# Patient Record
Sex: Female | Born: 1980 | Race: Black or African American | Hispanic: No | Marital: Single | Smoking: Current some day smoker
Health system: Southern US, Community
[De-identification: ages and names within clinical notes are randomized; demographics above are authoritative.]

## PROBLEM LIST (undated history)

## (undated) DIAGNOSIS — Z8632 Personal history of gestational diabetes: Secondary | ICD-10-CM

## (undated) HISTORY — DX: Personal history of gestational diabetes: Z86.32

## (undated) HISTORY — PX: ESOPHAGOGASTRODUODENOSCOPY ENDOSCOPY: SHX5814

---
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
## 2003-05-23 ENCOUNTER — Emergency Department (HOSPITAL_COMMUNITY): Admission: EM | Admit: 2003-05-23 | Discharge: 2003-05-23 | Payer: Self-pay | Admitting: Emergency Medicine

## 2004-07-25 ENCOUNTER — Emergency Department (HOSPITAL_COMMUNITY): Admission: EM | Admit: 2004-07-25 | Discharge: 2004-07-25 | Payer: Self-pay | Admitting: Emergency Medicine

## 2006-02-25 ENCOUNTER — Emergency Department (HOSPITAL_COMMUNITY): Admission: EM | Admit: 2006-02-25 | Discharge: 2006-02-25 | Payer: Self-pay | Admitting: Emergency Medicine

## 2006-05-03 ENCOUNTER — Emergency Department (HOSPITAL_COMMUNITY): Admission: EM | Admit: 2006-05-03 | Discharge: 2006-05-03 | Payer: Self-pay | Admitting: Family Medicine

## 2006-12-04 ENCOUNTER — Emergency Department (HOSPITAL_COMMUNITY): Admission: EM | Admit: 2006-12-04 | Discharge: 2006-12-04 | Payer: Self-pay | Admitting: Emergency Medicine

## 2007-02-11 ENCOUNTER — Emergency Department (HOSPITAL_COMMUNITY): Admission: EM | Admit: 2007-02-11 | Discharge: 2007-02-11 | Payer: Self-pay | Admitting: Emergency Medicine

## 2007-03-07 DIAGNOSIS — Z8632 Personal history of gestational diabetes: Secondary | ICD-10-CM

## 2007-03-07 HISTORY — DX: Personal history of gestational diabetes: Z86.32

## 2007-03-25 ENCOUNTER — Emergency Department (HOSPITAL_COMMUNITY): Admission: EM | Admit: 2007-03-25 | Discharge: 2007-03-25 | Payer: Self-pay | Admitting: Emergency Medicine

## 2007-04-07 ENCOUNTER — Emergency Department (HOSPITAL_COMMUNITY): Admission: EM | Admit: 2007-04-07 | Discharge: 2007-04-08 | Payer: Self-pay | Admitting: Emergency Medicine

## 2007-04-30 ENCOUNTER — Emergency Department (HOSPITAL_COMMUNITY): Admission: EM | Admit: 2007-04-30 | Discharge: 2007-04-30 | Payer: Self-pay | Admitting: Emergency Medicine

## 2007-07-02 ENCOUNTER — Emergency Department (HOSPITAL_COMMUNITY): Admission: EM | Admit: 2007-07-02 | Discharge: 2007-07-03 | Payer: Self-pay | Admitting: Emergency Medicine

## 2007-07-18 ENCOUNTER — Other Ambulatory Visit: Admission: RE | Admit: 2007-07-18 | Discharge: 2007-07-18 | Payer: Self-pay | Admitting: Obstetrics and Gynecology

## 2007-10-12 ENCOUNTER — Ambulatory Visit: Payer: Self-pay | Admitting: Obstetrics & Gynecology

## 2007-10-12 ENCOUNTER — Inpatient Hospital Stay (HOSPITAL_COMMUNITY): Admission: AD | Admit: 2007-10-12 | Discharge: 2007-10-12 | Payer: Self-pay | Admitting: Obstetrics & Gynecology

## 2007-12-01 ENCOUNTER — Inpatient Hospital Stay (HOSPITAL_COMMUNITY): Admission: AD | Admit: 2007-12-01 | Discharge: 2007-12-01 | Payer: Self-pay | Admitting: Obstetrics & Gynecology

## 2008-02-12 ENCOUNTER — Ambulatory Visit: Payer: Self-pay | Admitting: Family Medicine

## 2008-02-12 ENCOUNTER — Inpatient Hospital Stay (HOSPITAL_COMMUNITY): Admission: RE | Admit: 2008-02-12 | Discharge: 2008-02-15 | Payer: Self-pay | Admitting: Obstetrics & Gynecology

## 2008-07-23 ENCOUNTER — Emergency Department (HOSPITAL_COMMUNITY): Admission: EM | Admit: 2008-07-23 | Discharge: 2008-07-23 | Payer: Self-pay | Admitting: Emergency Medicine

## 2008-09-04 ENCOUNTER — Emergency Department (HOSPITAL_COMMUNITY): Admission: EM | Admit: 2008-09-04 | Discharge: 2008-09-04 | Payer: Self-pay | Admitting: Emergency Medicine

## 2008-09-08 ENCOUNTER — Encounter (INDEPENDENT_AMBULATORY_CARE_PROVIDER_SITE_OTHER): Payer: Self-pay | Admitting: *Deleted

## 2008-10-02 ENCOUNTER — Emergency Department (HOSPITAL_COMMUNITY): Admission: EM | Admit: 2008-10-02 | Discharge: 2008-10-02 | Payer: Self-pay | Admitting: Emergency Medicine

## 2008-10-16 ENCOUNTER — Emergency Department (HOSPITAL_COMMUNITY): Admission: EM | Admit: 2008-10-16 | Discharge: 2008-10-16 | Payer: Self-pay | Admitting: Emergency Medicine

## 2008-10-28 ENCOUNTER — Ambulatory Visit: Payer: Self-pay | Admitting: Gastroenterology

## 2008-10-28 DIAGNOSIS — R1013 Epigastric pain: Secondary | ICD-10-CM

## 2008-11-03 ENCOUNTER — Encounter: Payer: Self-pay | Admitting: Gastroenterology

## 2008-11-04 ENCOUNTER — Ambulatory Visit (HOSPITAL_COMMUNITY): Admission: RE | Admit: 2008-11-04 | Discharge: 2008-11-04 | Payer: Self-pay | Admitting: Gastroenterology

## 2008-11-04 ENCOUNTER — Ambulatory Visit: Payer: Self-pay | Admitting: Gastroenterology

## 2008-11-04 ENCOUNTER — Encounter: Payer: Self-pay | Admitting: Gastroenterology

## 2009-07-07 ENCOUNTER — Emergency Department (HOSPITAL_COMMUNITY): Admission: EM | Admit: 2009-07-07 | Discharge: 2009-07-07 | Payer: Self-pay | Admitting: Emergency Medicine

## 2009-08-03 IMAGING — CR DG ABDOMEN 1V
2 series · 2 of 2 positions shown · non-contrast
Comparison: None

CLINICAL DATA: Abdominal pain

ABDOMEN - 1 VIEW

[view not recorded (1 of 2)]
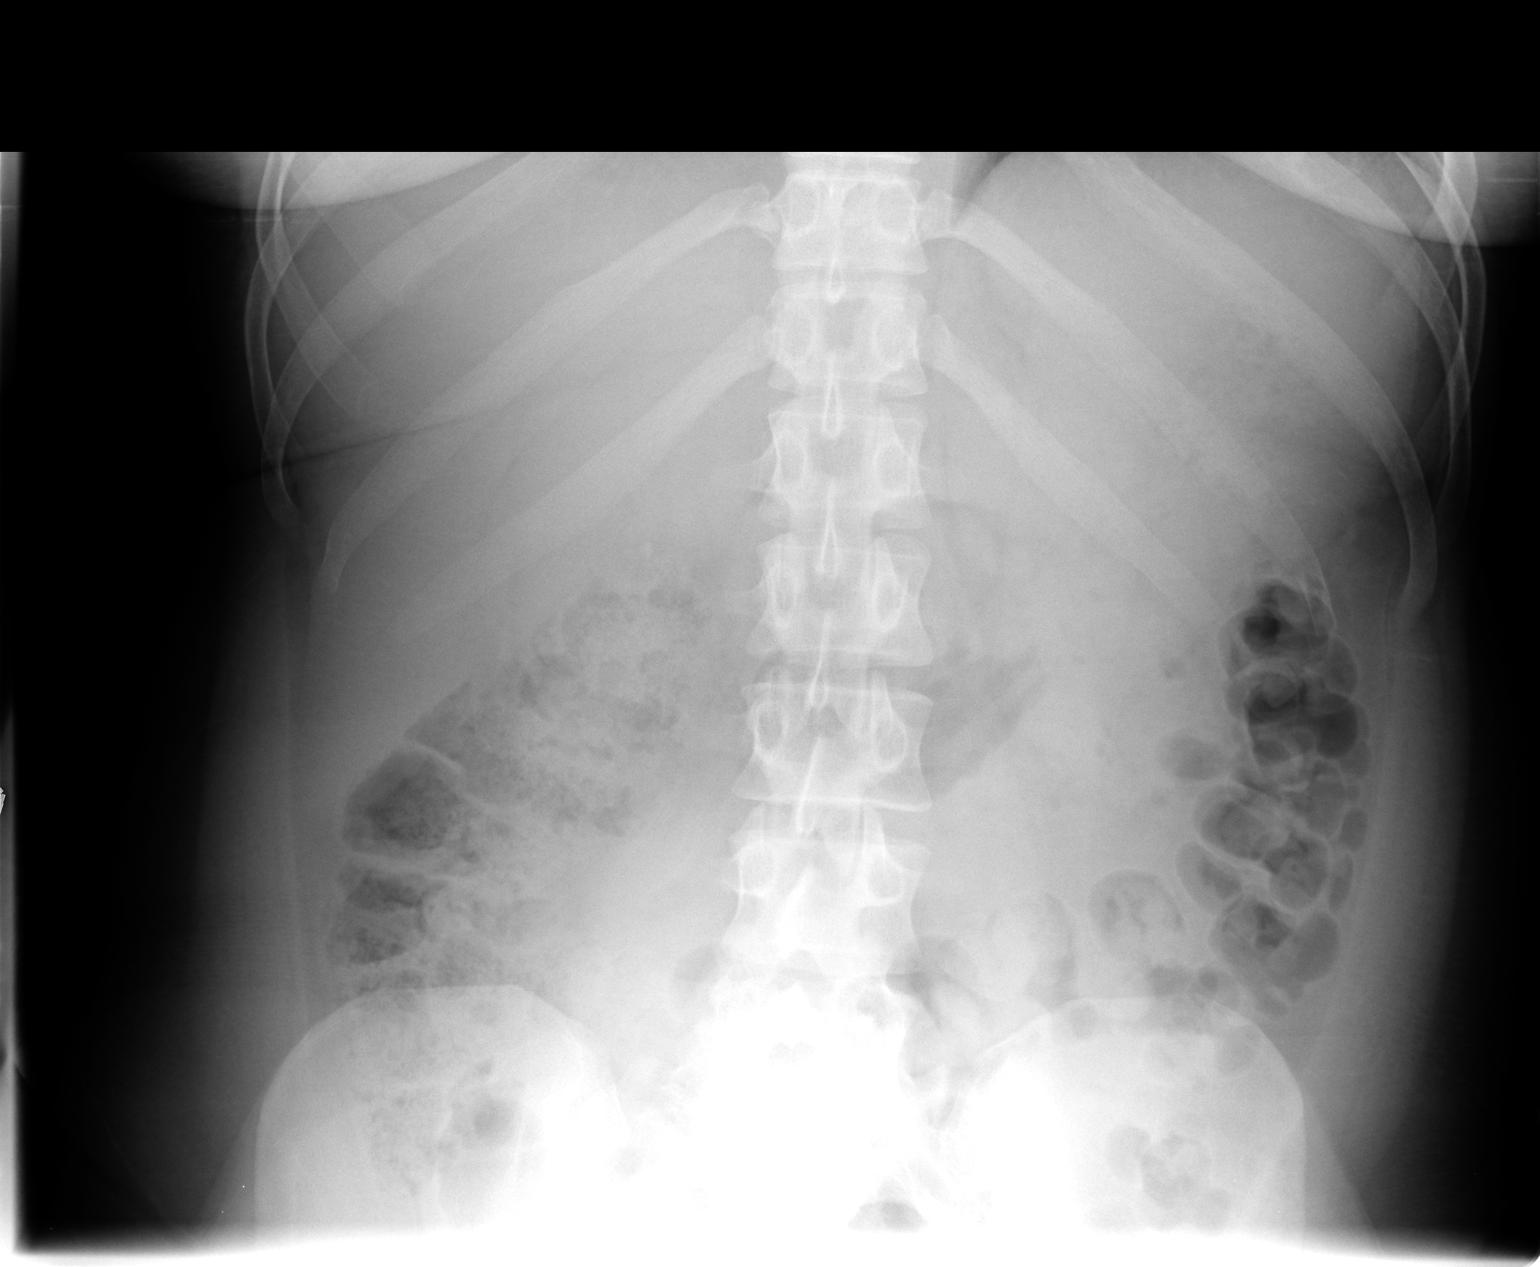

[view not recorded (2 of 2)]
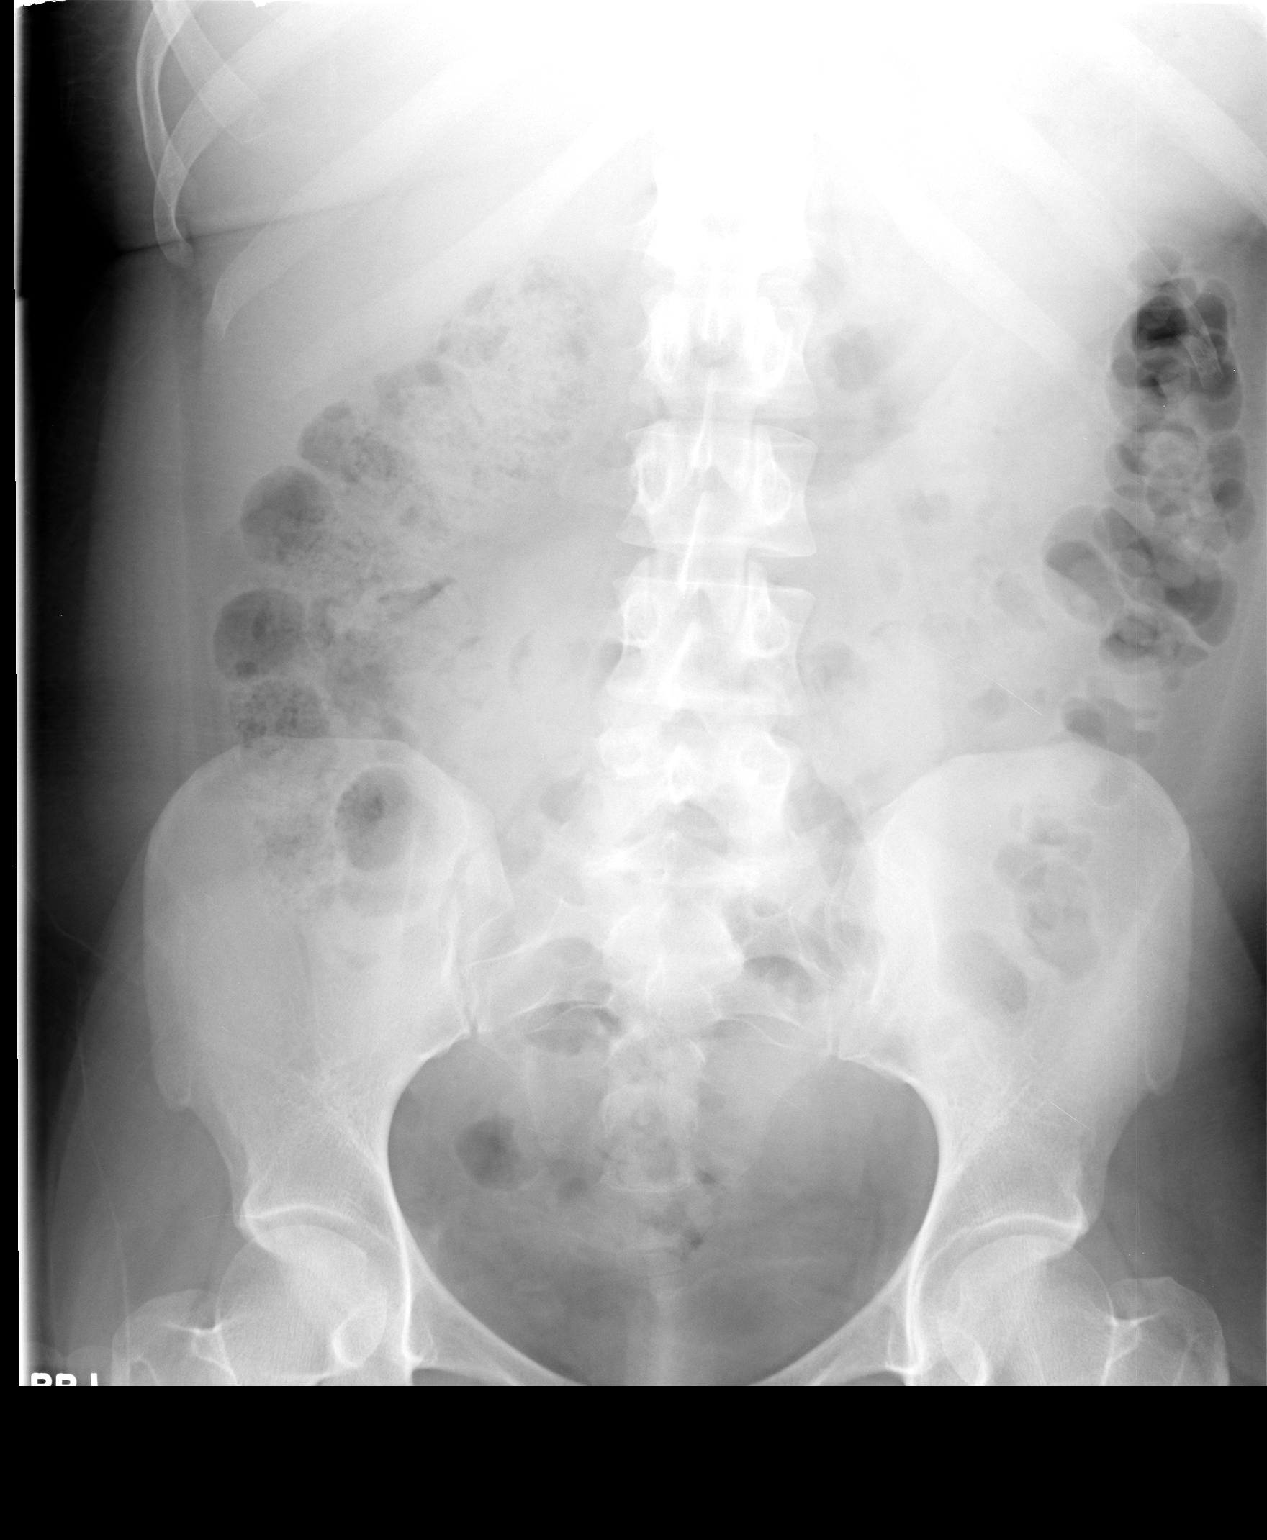

[2 of 2 positions shown; findings below may reference images not displayed]

FINDINGS: No free air.  Small bowel decompressed.  Moderate fecal
material in the proximal colon, decompressed distally.  No abnormal
abdominal calcifications.  Visualized bones unremarkable.
IMPRESSION: 1.  Nonobstructive bowel gas pattern with moderate proximal colonic
fecal material.

## 2009-10-15 ENCOUNTER — Emergency Department (HOSPITAL_COMMUNITY)
Admission: EM | Admit: 2009-10-15 | Discharge: 2009-10-15 | Payer: Self-pay | Source: Home / Self Care | Admitting: Emergency Medicine

## 2009-12-15 ENCOUNTER — Emergency Department (HOSPITAL_COMMUNITY): Admission: EM | Admit: 2009-12-15 | Discharge: 2009-12-15 | Payer: Self-pay | Admitting: Emergency Medicine

## 2010-02-27 ENCOUNTER — Emergency Department (HOSPITAL_COMMUNITY)
Admission: EM | Admit: 2010-02-27 | Discharge: 2010-02-27 | Payer: Self-pay | Source: Home / Self Care | Admitting: Emergency Medicine

## 2010-05-24 LAB — RAPID STREP SCREEN (MED CTR MEBANE ONLY): Streptococcus, Group A Screen (Direct): NEGATIVE

## 2010-06-12 LAB — URINALYSIS, ROUTINE W REFLEX MICROSCOPIC
Ketones, ur: NEGATIVE mg/dL
Nitrite: NEGATIVE
Urobilinogen, UA: 2 mg/dL — ABNORMAL HIGH (ref 0.0–1.0)
pH: 6 (ref 5.0–8.0)

## 2010-06-12 LAB — PREGNANCY, URINE: Preg Test, Ur: NEGATIVE

## 2010-07-12 ENCOUNTER — Emergency Department (HOSPITAL_COMMUNITY): Payer: Medicaid Other

## 2010-07-12 ENCOUNTER — Emergency Department (HOSPITAL_COMMUNITY)
Admission: EM | Admit: 2010-07-12 | Discharge: 2010-07-12 | Disposition: A | Discharge status: Home / Self Care | Payer: Medicaid Other | Attending: Emergency Medicine | Admitting: Emergency Medicine

## 2010-07-12 DIAGNOSIS — F17200 Nicotine dependence, unspecified, uncomplicated: Secondary | ICD-10-CM | POA: Insufficient documentation

## 2010-07-12 DIAGNOSIS — R42 Dizziness and giddiness: Secondary | ICD-10-CM | POA: Insufficient documentation

## 2010-07-12 DIAGNOSIS — R0989 Other specified symptoms and signs involving the circulatory and respiratory systems: Secondary | ICD-10-CM | POA: Insufficient documentation

## 2010-07-12 DIAGNOSIS — R071 Chest pain on breathing: Secondary | ICD-10-CM | POA: Insufficient documentation

## 2010-07-12 DIAGNOSIS — R0609 Other forms of dyspnea: Secondary | ICD-10-CM | POA: Insufficient documentation

## 2010-07-12 LAB — DIFFERENTIAL
Basophils Relative: 0 % (ref 0–1)
Lymphocytes Relative: 40 % (ref 12–46)
Lymphs Abs: 3.2 10*3/uL (ref 0.7–4.0)
Monocytes Absolute: 0.5 10*3/uL (ref 0.1–1.0)
Monocytes Relative: 6 % (ref 3–12)
Neutro Abs: 4.2 10*3/uL (ref 1.7–7.7)
Neutrophils Relative %: 53 % (ref 43–77)

## 2010-07-12 LAB — BASIC METABOLIC PANEL
BUN: 10 mg/dL (ref 6–23)
CO2: 27 mEq/L (ref 19–32)
Calcium: 10.3 mg/dL (ref 8.4–10.5)
Chloride: 101 mEq/L (ref 96–112)
Creatinine, Ser: 0.67 mg/dL (ref 0.4–1.2)
GFR calc Af Amer: >60 mL/min (ref >60)
Glucose, Bld: 107 mg/dL — ABNORMAL HIGH (ref 70–99)

## 2010-07-12 LAB — POCT CARDIAC MARKERS: Myoglobin, poc: 31.9 ng/mL (ref 12–200)

## 2010-07-12 LAB — CBC
HCT: 39.9 % (ref 36.0–46.0)
Hemoglobin: 13.1 g/dL (ref 12.0–15.0)
MCH: 28.6 pg (ref 26.0–34.0)
MCHC: 32.8 g/dL (ref 30.0–36.0)
MCV: 87.1 fL (ref 78.0–100.0)
RBC: 4.58 MIL/uL (ref 3.87–5.11)

## 2010-07-18 ENCOUNTER — Other Ambulatory Visit: Payer: Self-pay | Admitting: Obstetrics & Gynecology

## 2010-07-18 ENCOUNTER — Other Ambulatory Visit (HOSPITAL_COMMUNITY)
Admission: RE | Admit: 2010-07-18 | Discharge: 2010-07-18 | Disposition: A | Discharge status: Home / Self Care | Payer: Medicaid Other | Source: Ambulatory Visit | Attending: Obstetrics & Gynecology | Admitting: Obstetrics & Gynecology

## 2010-07-18 DIAGNOSIS — Z01419 Encounter for gynecological examination (general) (routine) without abnormal findings: Secondary | ICD-10-CM | POA: Insufficient documentation

## 2010-07-18 DIAGNOSIS — Z113 Encounter for screening for infections with a predominantly sexual mode of transmission: Secondary | ICD-10-CM | POA: Insufficient documentation

## 2010-07-19 NOTE — Op Note (Signed)
[name], [name]          ACCOUNT NO.:  [id number]   MEDICAL RECORD NO.:  [id number]          PATIENT TYPE:  AMB   LOCATION:  DAY                           FACILITY:  [facility]   PHYSICIAN:  Kassie Mends, M.D.      DATE OF BIRTH:  October 29, 1980   DATE OF PROCEDURE:  DATE OF DISCHARGE:  11/04/2008                               OPERATIVE REPORT   REFERRING Belen Zwahlen:  Nicoletta Dress. Colon Branch, MD   PROCEDURE:  Esophagogastroduodenoscopy with cold forceps biopsy of the  gastric mucosa.   INDICATION FOR EXAM:  [name] is a 30 year old female who  complains of burning epigastric pain that sometimes keeps her awake at  nighttime.  When the pain occurs, she has nausea and vomiting.  Spicy  foods may bring it on.  She reports no relief with Vicodin, milk of  magnesia, or Prilosec.  She does use ibuprofen.   FINDINGS:  1. Normal esophagus without evidence of Barrett's mass, erosion,      ulceration, or stricture.  2. Patchy erythema in the antrum without erosion or ulceration.      Biopsies obtained via cold forceps to evaluate for H. pylori      gastritis.  3. Normal duodenal bulb and second portion of the duodenum.   DIAGNOSES:  Mild gastritis may be contributing to abdominal pain.  She  may also have a component of uncontrolled gastroesophageal reflux  disease.   RECOMMENDATIONS:  1. Prevacid 30 mg 1 p.o. 30 minutes prior to breakfast and supper.  2. She should avoid gastric irritants.  She is given a handout on      gastric irritants.  She should also follow a low-fat diet.  She is      given a handout on low-fat diet and gastritis as well.  3. No aspirin, NSAIDs for 30 days.  No anticoagulation for 7 days.  4. We will call her with the results of her biopsies.   MEDICATIONS:  1. Demerol 75 mg IV.  2. Versed 4 mg IV.   PROCEDURE TECHNIQUE:  Physical exam was performed.  Informed consent was  obtained from the patient after explaining the benefits, risks, and  alternatives to  the procedure.  The patient was connected to the monitor  and placed in left lateral position.  Continuous oxygen was provided by  nasal cannula and IV medicine administered through an indwelling  cannula.  After administration of sedation, the patient's esophagus was  intubated and the scope was advanced under direct visualization to the  second portion of the duodenum.  The scope was removed  slowly by carefully examining the color, texture, anatomy, and integrity  of the mucosa on the way out.  The patient was recovered in endoscopy  and discharged home in satisfactory condition.   PATH:  Mild chronic gastritis.      Kassie Mends, M.D.  Electronically Signed     SM/MEDQ  D:  11/04/2008  T:  11/05/2008  Job:  [id number]

## 2010-11-24 LAB — URINE MICROSCOPIC-ADD ON

## 2010-11-24 LAB — URINALYSIS, ROUTINE W REFLEX MICROSCOPIC
Glucose, UA: NEGATIVE
Ketones, ur: NEGATIVE
Protein, ur: 100 — AB
pH: 7

## 2010-11-24 LAB — URINE CULTURE: Colony Count: >=100000

## 2010-11-25 LAB — URINALYSIS, ROUTINE W REFLEX MICROSCOPIC
Glucose, UA: NEGATIVE
Ketones, ur: NEGATIVE
Leukocytes, UA: NEGATIVE
Nitrite: POSITIVE — AB
Nitrite: NEGATIVE
Specific Gravity, Urine: >1.03 — ABNORMAL HIGH
Specific Gravity, Urine: 1.025
pH: 5
pH: 6

## 2010-11-25 LAB — DIFFERENTIAL
Basophils Absolute: 0
Basophils Absolute: 0
Eosinophils Relative: 2
Lymphocytes Relative: 47 — ABNORMAL HIGH
Lymphocytes Relative: 36
Lymphs Abs: 1.9
Monocytes Absolute: 0.4
Neutro Abs: 3.1
Neutro Abs: 2.8
Neutrophils Relative %: 43

## 2010-11-25 LAB — COMPREHENSIVE METABOLIC PANEL
AST: 14
Albumin: 3.8
BUN: 9
Calcium: 9.5
Chloride: 104
Creatinine, Ser: 0.61
GFR calc Af Amer: >60
Total Bilirubin: 0.6
Total Protein: 7.3

## 2010-11-25 LAB — PREGNANCY, URINE: Preg Test, Ur: NEGATIVE

## 2010-11-25 LAB — BASIC METABOLIC PANEL
BUN: 8
Calcium: 8.9
Creatinine, Ser: 0.7
GFR calc non Af Amer: >60
Glucose, Bld: 147 — ABNORMAL HIGH

## 2010-11-25 LAB — CBC
HCT: 37.4
MCHC: 34.4
MCV: 86.2
Platelets: 221
Platelets: 181
RDW: 12.3
RDW: 12.6

## 2010-11-25 LAB — URINE MICROSCOPIC-ADD ON

## 2010-11-29 LAB — URINALYSIS, ROUTINE W REFLEX MICROSCOPIC
Ketones, ur: NEGATIVE
Nitrite: NEGATIVE
Protein, ur: NEGATIVE
pH: 6.5

## 2010-11-29 LAB — INFLUENZA A+B VIRUS AG-DIRECT(RAPID): Influenza B Ag: NEGATIVE

## 2010-11-29 LAB — URINE MICROSCOPIC-ADD ON

## 2010-12-02 LAB — WET PREP, GENITAL: Trich, Wet Prep: NONE SEEN

## 2010-12-05 ENCOUNTER — Encounter: Payer: Self-pay | Admitting: Emergency Medicine

## 2010-12-05 ENCOUNTER — Emergency Department (HOSPITAL_COMMUNITY)
Admission: EM | Admit: 2010-12-05 | Discharge: 2010-12-05 | Disposition: A | Discharge status: Home / Self Care | Payer: Medicaid Other | Attending: Emergency Medicine | Admitting: Emergency Medicine

## 2010-12-05 DIAGNOSIS — R21 Rash and other nonspecific skin eruption: Secondary | ICD-10-CM | POA: Insufficient documentation

## 2010-12-05 DIAGNOSIS — F17200 Nicotine dependence, unspecified, uncomplicated: Secondary | ICD-10-CM | POA: Insufficient documentation

## 2010-12-05 DIAGNOSIS — T7840XA Allergy, unspecified, initial encounter: Secondary | ICD-10-CM

## 2010-12-05 LAB — GC/CHLAMYDIA PROBE AMP, GENITAL: GC Probe Amp, Genital: NEGATIVE

## 2010-12-05 MED ORDER — PREDNISONE 20 MG PO TABS
60.0000 mg | ORAL_TABLET | Freq: Once | ORAL | Status: AC
  Administered 2010-12-05: 60 mg via ORAL
  Filled 2010-12-05: qty 3

## 2010-12-05 MED ORDER — FAMOTIDINE 20 MG PO TABS
20.0000 mg | ORAL_TABLET | Freq: Once | ORAL | Status: AC
  Administered 2010-12-05: 20 mg via ORAL
  Filled 2010-12-05: qty 1

## 2010-12-05 MED ORDER — PREDNISONE 10 MG PO TABS: 20.0000 mg | ORAL_TABLET | Freq: Every day | ORAL | Status: AC

## 2010-12-05 MED ORDER — DIPHENHYDRAMINE HCL 25 MG PO CAPS
50.0000 mg | ORAL_CAPSULE | Freq: Once | ORAL | Status: AC
  Administered 2010-12-05: 50 mg via ORAL
  Filled 2010-12-05: qty 2

## 2010-12-05 NOTE — ED Provider Notes (Signed)
History     CSN: [id number] Arrival date & time: 12/05/2010  5:28 PM  Chief Complaint  Patient presents with  . Allergic Reaction    (Consider location/radiation/quality/duration/timing/severity/associated sxs/prior treatment) HPI Comments: Pt put grooming oil on her child's hair yest.  She kissed him on the head last PM and since has broken out in a fine rash in the periorbital area.  No diff breathing or swallowing.  Took one dose of benadryl with minimal results.  Patient is a 30 y.o. female presenting with allergic reaction. The history is provided by the patient. No language interpreter was used.  Allergic Reaction The primary symptoms are  rash. The primary symptoms do not include wheezing, shortness of breath, angioedema or urticaria. The current episode started yesterday. The problem has not changed since onset.This is a new problem.  The rash is associated with itching.   Significant symptoms also include itching. Significant symptoms that are not present include eye redness, flushing or rhinorrhea.    History reviewed. No pertinent past medical history.  History reviewed. No pertinent past surgical history.  Family History  Problem Relation Age of Onset  . Cancer Mother   . Diabetes Father     History  Substance Use Topics  . Smoking status: Current Everyday Smoker -- 0.2 packs/day for 2 years    Types: Cigarettes  . Smokeless tobacco: Never Used  . Alcohol Use: Yes     twice a month    OB History    Grav Para Term Preterm Abortions TAB SAB Ect Mult Living   1 1 1       1       Review of Systems  HENT: Negative for rhinorrhea.   Eyes: Negative for redness.  Respiratory: Negative for shortness of breath and wheezing.   Skin: Positive for itching and rash. Negative for flushing.  All other systems reviewed and are negative.    Allergies  Other  Home Medications   Current Outpatient Rx  Name Route Sig Dispense Refill  . DIPHENHYDRAMINE HCL 25 MG PO  CAPS Oral Take 25 mg by mouth at bedtime as needed. For allergic reaction       BP 128/80  Pulse 70  Temp(Src) 98 F (36.7 C) (Oral)  Resp 18  Ht 5\' 2"  (1.575 m)  Wt 168 lb (76.204 kg)  BMI 30.73 kg/m2  SpO2 99%  LMP 12/05/2010  Physical Exam  Nursing note and vitals reviewed. Constitutional: She is oriented to person, place, and time. She appears well-developed and well-nourished. No distress.  HENT:  Head: Normocephalic and atraumatic.       Tiny blistery rash surrounding mouth on erythematous base.  Eyes: EOM are normal.  Neck: Normal range of motion.  Cardiovascular: Normal rate, regular rhythm and normal heart sounds.   Pulmonary/Chest: Effort normal and breath sounds normal. No accessory muscle usage or stridor. Not tachypneic and not bradypneic. No respiratory distress. She has no wheezes.  Abdominal: Soft. She exhibits no distension. There is no tenderness.  Musculoskeletal: Normal range of motion.  Neurological: She is alert and oriented to person, place, and time.  Skin: Skin is warm and dry.  Psychiatric: She has a normal mood and affect. Judgment normal.    ED Course  Procedures (including critical care time)  Labs Reviewed - No data to display No results found.   No diagnosis found.    MDM          Worthy Rancher, PA 12/05/10 1827

## 2010-12-05 NOTE — ED Notes (Signed)
Pt presents with complaint of minor lip swelling and rash around mouth. Denies SOB, wheezing or any respiratory distress,  Pt states has same type reaction when using anything on lip other than Vaseline,

## 2010-12-05 NOTE — ED Notes (Signed)
Patient lips swollen with rash around mouth. Per patient this has happened before. Patient unable to use chap stick due to same kind of reaction. Patient denies any difficulty breathing. Reports taking a benadryl with no relief.

## 2010-12-06 NOTE — ED Provider Notes (Signed)
History     CSN: [id number] Arrival date & time: 12/05/2010  5:28 PM  Chief Complaint  Patient presents with  . Allergic Reaction    (Consider location/radiation/quality/duration/timing/severity/associated sxs/prior treatment) HPI Comments: Pt put grooming oil on her child's hair yest.  She kissed him on the head last PM and since has broken out in a fine rash in the periorbital area.  No diff breathing or swallowing.  Took one dose of benadryl with minimal results.  Patient is a 30 y.o. female presenting with allergic reaction. The history is provided by the patient. No language interpreter was used.  Allergic Reaction The primary symptoms are  rash. The primary symptoms do not include wheezing, shortness of breath, angioedema or urticaria. The current episode started yesterday. The problem has not changed since onset.This is a new problem.  The rash is associated with itching.   Significant symptoms also include itching. Significant symptoms that are not present include eye redness, flushing or rhinorrhea.    History reviewed. No pertinent past medical history.  History reviewed. No pertinent past surgical history.  Family History  Problem Relation Age of Onset  . Cancer Mother   . Diabetes Father     History  Substance Use Topics  . Smoking status: Current Everyday Smoker -- 0.2 packs/day for 2 years    Types: Cigarettes  . Smokeless tobacco: Never Used  . Alcohol Use: Yes     twice a month    OB History    Grav Para Term Preterm Abortions TAB SAB Ect Mult Living   1 1 1       1       Review of Systems  HENT: Negative for rhinorrhea.   Eyes: Negative for redness.  Respiratory: Negative for shortness of breath and wheezing.   Skin: Positive for itching and rash. Negative for flushing.  All other systems reviewed and are negative.    Allergies  Other  Home Medications   Current Outpatient Rx  Name Route Sig Dispense Refill  . DIPHENHYDRAMINE HCL 25 MG PO  CAPS Oral Take 25 mg by mouth at bedtime as needed. For allergic reaction       BP 128/80  Pulse 70  Temp(Src) 98 F (36.7 C) (Oral)  Resp 18  Ht 5\' 2"  (1.575 m)  Wt 168 lb (76.204 kg)  BMI 30.73 kg/m2  SpO2 99%  LMP 12/05/2010  Physical Exam  Nursing note and vitals reviewed. Constitutional: She is oriented to person, place, and time. She appears well-developed and well-nourished. No distress.  HENT:  Head: Normocephalic and atraumatic.       Tiny blistery rash surrounding mouth on erythematous base.  Eyes: EOM are normal.  Neck: Normal range of motion.  Cardiovascular: Normal rate, regular rhythm and normal heart sounds.   Pulmonary/Chest: Effort normal and breath sounds normal. No accessory muscle usage or stridor. Not tachypneic and not bradypneic. No respiratory distress. She has no wheezes.  Abdominal: Soft. She exhibits no distension. There is no tenderness.  Musculoskeletal: Normal range of motion.  Neurological: She is alert and oriented to person, place, and time.  Skin: Skin is warm and dry.  Psychiatric: She has a normal mood and affect. Judgment normal.    ED Course  Procedures (including critical care time)  Labs Reviewed - No data to display No results found.   No diagnosis found.    MDM          Worthy Rancher, PA 12/05/10 1827  Medical  screening examination/treatment/procedure(s) were performed by non-physician practitioner and as supervising physician I was immediately available for consultation/collaboration.   Nelia Shi, MD 12/06/10 [phone]

## 2010-12-09 LAB — GLUCOSE, CAPILLARY
Glucose-Capillary: 104 mg/dL — ABNORMAL HIGH (ref 70–99)
Glucose-Capillary: 105 mg/dL — ABNORMAL HIGH (ref 70–99)
Glucose-Capillary: 108 mg/dL — ABNORMAL HIGH (ref 70–99)
Glucose-Capillary: 122 mg/dL — ABNORMAL HIGH (ref 70–99)
Glucose-Capillary: 137 mg/dL — ABNORMAL HIGH (ref 70–99)
Glucose-Capillary: 84 mg/dL (ref 70–99)
Glucose-Capillary: 95 mg/dL (ref 70–99)
Glucose-Capillary: 99 mg/dL (ref 70–99)

## 2010-12-09 LAB — CBC
HCT: 28.9 % — ABNORMAL LOW (ref 36.0–46.0)
Hemoglobin: 9.6 g/dL — ABNORMAL LOW (ref 12.0–15.0)
MCHC: 33.3 g/dL (ref 30.0–36.0)
MCV: 86.7 fL (ref 78.0–100.0)
Platelets: 104 10*3/uL — ABNORMAL LOW (ref 150–400)
RBC: 3.34 MIL/uL — ABNORMAL LOW (ref 3.87–5.11)
RDW: 13.4 % (ref 11.5–15.5)
WBC: 10.4 10*3/uL (ref 4.0–10.5)
WBC: 6 10*3/uL (ref 4.0–10.5)

## 2010-12-09 LAB — BASIC METABOLIC PANEL
BUN: 5 mg/dL — ABNORMAL LOW (ref 6–23)
CO2: 21 mEq/L (ref 19–32)
Chloride: 108 mEq/L (ref 96–112)
Creatinine, Ser: 0.54 mg/dL (ref 0.4–1.2)
Glucose, Bld: 86 mg/dL (ref 70–99)
Potassium: 3.9 mEq/L (ref 3.5–5.1)

## 2010-12-09 LAB — RPR: RPR Ser Ql: NONREACTIVE

## 2010-12-09 LAB — GLUCOSE, RANDOM: Glucose, Bld: 117 mg/dL — ABNORMAL HIGH (ref 70–99)

## 2010-12-15 LAB — STREP A DNA PROBE: Group A Strep Probe: NEGATIVE

## 2011-01-06 ENCOUNTER — Encounter (HOSPITAL_COMMUNITY): Payer: Self-pay

## 2011-01-06 ENCOUNTER — Emergency Department (HOSPITAL_COMMUNITY): Payer: Medicaid Other

## 2011-01-06 ENCOUNTER — Emergency Department (HOSPITAL_COMMUNITY)
Admission: EM | Admit: 2011-01-06 | Discharge: 2011-01-06 | Disposition: A | Discharge status: Home / Self Care | Payer: Medicaid Other | Attending: Emergency Medicine | Admitting: Emergency Medicine

## 2011-01-06 DIAGNOSIS — J329 Chronic sinusitis, unspecified: Secondary | ICD-10-CM | POA: Insufficient documentation

## 2011-01-06 DIAGNOSIS — F17200 Nicotine dependence, unspecified, uncomplicated: Secondary | ICD-10-CM | POA: Insufficient documentation

## 2011-01-06 DIAGNOSIS — J40 Bronchitis, not specified as acute or chronic: Secondary | ICD-10-CM | POA: Insufficient documentation

## 2011-01-06 MED ORDER — FEXOFENADINE-PSEUDOEPHED ER 60-120 MG PO TB12: 1.0000 | ORAL_TABLET | Freq: Two times a day (BID) | ORAL | Status: DC

## 2011-01-06 MED ORDER — PROMETHAZINE-CODEINE 6.25-10 MG/5ML PO SYRP: 5.0000 mL | ORAL_SOLUTION | ORAL | Status: AC | PRN

## 2011-01-06 MED ORDER — DOXYCYCLINE HYCLATE 100 MG PO CAPS: 100.0000 mg | ORAL_CAPSULE | Freq: Two times a day (BID) | ORAL | Status: AC

## 2011-01-06 NOTE — ED Notes (Signed)
Pt presents with cough and sore throat x 1 week. Pt states pain feels like she is swallowing razor blade. No fever noted. Pt also notes nausea and bloody nose. NAD at this time.

## 2011-01-06 NOTE — ED Notes (Signed)
Pt a/ox4. Resp even and unlabored. NAD at this time. D/C instructions, Rx x3, and work note reviewed with pt. Pt verbalized understanding. Pt ambulated to POV with steady gate.

## 2011-01-06 NOTE — ED Provider Notes (Signed)
History     CSN: [id number] Arrival date & time: 01/06/2011  2:37 PM   First MD Initiated Contact with Patient 01/06/11 1438      Chief Complaint  Patient presents with  . Cough  . Sore Throat    (Consider location/radiation/quality/duration/timing/severity/associated sxs/prior treatment) Patient is a 30 y.o. female presenting with cough and pharyngitis. The history is provided by the patient.  Cough This is a new problem. The current episode started more than 1 week ago. The problem occurs hourly. The problem has been gradually worsening. The cough is productive of sputum. Associated symptoms include chest pain, chills, headaches, rhinorrhea, sore throat and myalgias. Pertinent negatives include no shortness of breath and no wheezing. Treatments tried: OTC cough drops and antihistamines. She is a smoker. Her past medical history is significant for bronchitis.  Sore Throat Associated symptoms include chest pain, chills, coughing, headaches, myalgias and a sore throat. Pertinent negatives include no abdominal pain, arthralgias or neck pain.    History reviewed. No pertinent past medical history.  History reviewed. No pertinent past surgical history.  Family History  Problem Relation Age of Onset  . Cancer Mother   . Diabetes Father     History  Substance Use Topics  . Smoking status: Current Some Day Smoker -- 0.2 packs/day for 2 years    Types: Cigarettes  . Smokeless tobacco: Never Used  . Alcohol Use: Yes     twice a month    OB History    Grav Para Term Preterm Abortions TAB SAB Ect Mult Living   1 1 1       1       Review of Systems  Constitutional: Positive for chills. Negative for activity change.       All ROS Neg except as noted in HPI  HENT: Positive for sore throat and rhinorrhea. Negative for nosebleeds and neck pain.   Eyes: Negative for photophobia and discharge.  Respiratory: Positive for cough. Negative for shortness of breath and wheezing.     Cardiovascular: Positive for chest pain. Negative for palpitations.  Gastrointestinal: Negative for abdominal pain and blood in stool.  Genitourinary: Negative for dysuria, frequency and hematuria.  Musculoskeletal: Positive for myalgias. Negative for back pain and arthralgias.  Skin: Negative.   Neurological: Positive for headaches. Negative for dizziness, seizures and speech difficulty.  Psychiatric/Behavioral: Negative for hallucinations and confusion.    Allergies  Other  Home Medications   Current Outpatient Rx  Name Route Sig Dispense Refill  . DIPHENHYDRAMINE HCL 25 MG PO CAPS Oral Take 25 mg by mouth at bedtime as needed. For allergic reaction     . MENTHOL 10 MG MT LOZG Mouth/Throat Use as directed 1 each in the mouth or throat as needed. For cough and sore throat       BP 133/80  Pulse 82  Temp(Src) 98.2 F (36.8 C) (Oral)  Resp 18  Ht 5\' 1"  (1.549 m)  Wt 160 lb (72.576 kg)  BMI 30.23 kg/m2  SpO2 100%  LMP 01/05/2011  Physical Exam  Nursing note and vitals reviewed. Constitutional: She is oriented to person, place, and time. She appears well-developed and well-nourished.  Non-toxic appearance.  HENT:  Head: Normocephalic.  Right Ear: Tympanic membrane and external ear normal.  Left Ear: Tympanic membrane and external ear normal.       Increase redness of the posterior pharynx. Nasal congestion noted.  Eyes: EOM and lids are normal. Pupils are equal, round, and reactive to light.  Neck: Normal range of motion. Neck supple. Carotid bruit is not present.  Cardiovascular: Normal rate, regular rhythm, normal heart sounds, intact distal pulses and normal pulses.   Pulmonary/Chest: Breath sounds normal. No respiratory distress.       Chest wall soreness. Course breath sounds.  Abdominal: Soft. Bowel sounds are normal. There is no tenderness. There is no guarding.  Musculoskeletal: Normal range of motion.  Lymphadenopathy:       Head (right side): No submandibular  adenopathy present.       Head (left side): No submandibular adenopathy present.    She has no cervical adenopathy.  Neurological: She is alert and oriented to person, place, and time. She has normal strength. No cranial nerve deficit or sensory deficit.  Skin: Skin is warm and dry. No rash noted.  Psychiatric: She has a normal mood and affect. Her speech is normal.    ED Course  Procedures (including critical care time)  Labs Reviewed - No data to display No results found.   Dx: Bronchitis   2. Sinusitis   MDM  I have reviewed nursing notes, vital signs, and all appropriate lab and imaging results for this patient.        Kathie Dike, [place] 01/06/11 [phone]

## 2011-01-07 NOTE — ED Provider Notes (Signed)
Evaluation and management procedures were performed by the mid-level provider (PA/NP/CNM) under my supervision/collaboration. I was present and available during the ED course. Modesty Rudy Y.   Marilynne Dupuis Y. Camaria Gerald, MD 01/07/11 2016 

## 2011-03-16 ENCOUNTER — Emergency Department (HOSPITAL_COMMUNITY)
Admission: EM | Admit: 2011-03-16 | Discharge: 2011-03-17 | Disposition: A | Discharge status: Home / Self Care | Payer: Medicaid Other | Attending: Emergency Medicine | Admitting: Emergency Medicine

## 2011-03-16 ENCOUNTER — Encounter (HOSPITAL_COMMUNITY): Payer: Self-pay | Admitting: Emergency Medicine

## 2011-03-16 DIAGNOSIS — R51 Headache: Secondary | ICD-10-CM | POA: Insufficient documentation

## 2011-03-16 DIAGNOSIS — R05 Cough: Secondary | ICD-10-CM | POA: Insufficient documentation

## 2011-03-16 DIAGNOSIS — R112 Nausea with vomiting, unspecified: Secondary | ICD-10-CM | POA: Insufficient documentation

## 2011-03-16 DIAGNOSIS — R109 Unspecified abdominal pain: Secondary | ICD-10-CM | POA: Insufficient documentation

## 2011-03-16 DIAGNOSIS — R61 Generalized hyperhidrosis: Secondary | ICD-10-CM | POA: Insufficient documentation

## 2011-03-16 DIAGNOSIS — R059 Cough, unspecified: Secondary | ICD-10-CM | POA: Insufficient documentation

## 2011-03-16 DIAGNOSIS — R509 Fever, unspecified: Secondary | ICD-10-CM | POA: Insufficient documentation

## 2011-03-16 DIAGNOSIS — R197 Diarrhea, unspecified: Secondary | ICD-10-CM | POA: Insufficient documentation

## 2011-03-16 DIAGNOSIS — IMO0001 Reserved for inherently not codable concepts without codable children: Secondary | ICD-10-CM | POA: Insufficient documentation

## 2011-03-16 LAB — URINALYSIS, ROUTINE W REFLEX MICROSCOPIC
Bilirubin Urine: NEGATIVE
Leukocytes, UA: NEGATIVE
Nitrite: NEGATIVE
Specific Gravity, Urine: 1.025 (ref 1.005–1.030)
Urobilinogen, UA: 0.2 mg/dL (ref 0.0–1.0)
pH: 6 (ref 5.0–8.0)

## 2011-03-16 LAB — URINE MICROSCOPIC-ADD ON

## 2011-03-16 LAB — POCT PREGNANCY, URINE: Preg Test, Ur: NEGATIVE

## 2011-03-16 MED ORDER — ONDANSETRON 8 MG PO TBDP: 8.0000 mg | ORAL_TABLET | Freq: Three times a day (TID) | ORAL | Status: AC | PRN

## 2011-03-16 MED ORDER — OXYCODONE-ACETAMINOPHEN 5-325 MG PO TABS
2.0000 | ORAL_TABLET | Freq: Once | ORAL | Status: AC
  Administered 2011-03-16: 2 via ORAL
  Filled 2011-03-16: qty 2

## 2011-03-16 MED ORDER — ONDANSETRON 8 MG PO TBDP
8.0000 mg | ORAL_TABLET | Freq: Once | ORAL | Status: AC
  Administered 2011-03-16: 8 mg via ORAL
  Filled 2011-03-16: qty 1

## 2011-03-16 NOTE — ED Provider Notes (Signed)
History  Scribed for Anita Gaskins, MD, the patient was seen in room [facility]. This chart was scribed by Candelaria Stagers. The patient's care started at 10:25 PM   CSN: [id number]  Arrival date & time 03/16/11  1928   First MD Initiated Contact with Patient 03/16/11 2212      Chief Complaint  Patient presents with  . Nausea  . Emesis  . Cough  . Fever     The history is provided by the patient.   [name] is a 31 y.o. female who presents to the Emergency Department complaining of nausea, cough, and fever that began about two days ago.  Pt is also experiencing cough, diarrhea, sweats, body aches, abdmoinal cramping, and headache.  She denies dysuria and vag bleeding.  She has no h/o surgeries.  Pt states that she has been in contact with sick individuals.  She has taken ibuprofen with no relief.   PMH - none  History reviewed. No pertinent past surgical history.  Family History  Problem Relation Age of Onset  . Cancer Mother   . Diabetes Father     History  Substance Use Topics  . Smoking status: Current Some Day Smoker -- 0.2 packs/day for 2 years    Types: Cigarettes  . Smokeless tobacco: Never Used  . Alcohol Use: Yes     twice a month    OB History    Grav Para Term Preterm Abortions TAB SAB Ect Mult Living   1 1 1       1       Review of Systems  Constitutional: Positive for fever and diaphoresis.  Respiratory: Positive for cough.   Gastrointestinal: Positive for nausea, vomiting, abdominal pain and diarrhea. Negative for blood in stool.  Genitourinary: Negative for dysuria.  Neurological: Positive for headaches.  All other systems reviewed and are negative.    Allergies  Other  Home Medications   Current Outpatient Rx  Name Route Sig Dispense Refill  . DEXTROMETHORPHAN POLISTIREX ER 30 MG/5ML PO LQCR Oral Take 60 mg by mouth every 12 (twelve) hours as needed. For cough    . IBUPROFEN 200 MG PO TABS Oral Take 400 mg by mouth 2  (two) times daily as needed. For body pain/ headache pain    . MENTHOL 10 MG MT LOZG Mouth/Throat Use as directed 1 each in the mouth or throat as needed. For cough and sore throat       BP 107/70  Pulse 86  Temp(Src) 99 F (37.2 C) (Oral)  Resp 20  Ht 5\' 2"  (1.575 m)  Wt 165 lb (74.844 kg)  BMI 30.18 kg/m2  SpO2 99%  LMP 02/18/2011  Physical Exam CONSTITUTIONAL: Well developed/well nourished HEAD AND FACE: Normocephalic/atraumatic EYES: EOMI/PERRL ENMT: Mucous membranes moist, uvula midline, pharynx normal  NECK: supple no meningeal signs SPINE:entire spine nontender CV: S1/S2 noted, no murmurs/rubs/gallops noted LUNGS: Lungs are clear to auscultation bilaterally, no apparent distress ABDOMEN: soft, nontender, no rebound or guarding GU:no cva tenderness NEURO: Pt is awake/alert, moves all extremitiesx4 EXTREMITIES: pulses normal, full ROM SKIN: warm, color normal PSYCH: no abnormalities of mood noted   ED Course  Procedures  DIAGNOSTIC STUDIES: Oxygen Saturation is 99% on room air, normal by my interpretation.    COORDINATION OF CARE:  Pt improved Taking PO Stable for d/c   Labs Reviewed  URINALYSIS, ROUTINE W REFLEX MICROSCOPIC - Abnormal; Notable for the following:    Ketones, ur TRACE (*)    Protein,  ur 30 (*)    All other components within normal limits  URINE MICROSCOPIC-ADD ON - Abnormal; Notable for the following:    Squamous Epithelial / LPF MANY (*)    All other components within normal limits  PREGNANCY, URINE  POCT PREGNANCY, URINE     MDM  Nursing notes reviewed and considered in documentation All labs/vitals reviewed and considered  I personally performed the services described in this documentation, which was scribed in my presence. The recorded information has been reviewed and considered.           Anita Gaskins, MD 03/16/11 2325

## 2011-03-16 NOTE — ED Notes (Signed)
Pt given water to drink for fluid challenged.  She was instructed to call if she experienced nausea or vomiting.

## 2011-03-16 NOTE — ED Notes (Signed)
Coughed thick green mucous - friend questions if she can have mucinex at home when she gets over the N & [place] - has some at home

## 2011-03-16 NOTE — ED Notes (Signed)
Patient c/o cough, nausea, vomiting, diarrhea and fever x 2 days.  A&O; skin w/d. Respirations even and unlabored; able to speak in complete sentences without difficulty.

## 2011-08-02 ENCOUNTER — Other Ambulatory Visit: Payer: Self-pay | Admitting: Family Medicine

## 2011-08-02 DIAGNOSIS — Z1231 Encounter for screening mammogram for malignant neoplasm of breast: Secondary | ICD-10-CM

## 2011-08-02 DIAGNOSIS — Z803 Family history of malignant neoplasm of breast: Secondary | ICD-10-CM

## 2011-08-05 ENCOUNTER — Emergency Department (HOSPITAL_COMMUNITY)
Admission: EM | Admit: 2011-08-05 | Discharge: 2011-08-05 | Disposition: A | Discharge status: Home / Self Care | Payer: Medicaid Other | Attending: Emergency Medicine | Admitting: Emergency Medicine

## 2011-08-05 ENCOUNTER — Encounter (HOSPITAL_COMMUNITY): Payer: Self-pay

## 2011-08-05 DIAGNOSIS — F17200 Nicotine dependence, unspecified, uncomplicated: Secondary | ICD-10-CM | POA: Insufficient documentation

## 2011-08-05 DIAGNOSIS — T7840XA Allergy, unspecified, initial encounter: Secondary | ICD-10-CM

## 2011-08-05 DIAGNOSIS — J45909 Unspecified asthma, uncomplicated: Secondary | ICD-10-CM | POA: Insufficient documentation

## 2011-08-05 DIAGNOSIS — R21 Rash and other nonspecific skin eruption: Secondary | ICD-10-CM | POA: Insufficient documentation

## 2011-08-05 MED ORDER — FAMOTIDINE 20 MG PO TABS
20.0000 mg | ORAL_TABLET | Freq: Once | ORAL | Status: AC
  Administered 2011-08-05: 20 mg via ORAL
  Filled 2011-08-05: qty 1

## 2011-08-05 MED ORDER — DIPHENHYDRAMINE HCL 25 MG PO CAPS
25.0000 mg | ORAL_CAPSULE | Freq: Once | ORAL | Status: AC
  Administered 2011-08-05: 25 mg via ORAL
  Filled 2011-08-05: qty 1

## 2011-08-05 MED ORDER — DEXAMETHASONE SODIUM PHOSPHATE 4 MG/ML IJ SOLN
10.0000 mg | Freq: Once | INTRAMUSCULAR | Status: AC
  Administered 2011-08-05: 10 mg via INTRAMUSCULAR
  Filled 2011-08-05: qty 3

## 2011-08-05 MED ORDER — PREDNISONE 10 MG PO TABS: ORAL_TABLET | ORAL | Status: DC

## 2011-08-05 NOTE — ED Provider Notes (Signed)
History     CSN: [id number]  Arrival date & time 08/05/11  [place]   First MD Initiated Contact with Patient 08/05/11 1916      Chief Complaint  Patient presents with  . Allergic Reaction    (Consider location/radiation/quality/duration/timing/severity/associated sxs/prior treatment) HPI Comments: Patient c/o redness, rash and facial swelling that began on the day prior to ED arrival.  She states that she took benadryl with improvement.  Today, symptoms returned while at work.  Noticed a burning sensation to her lips, chin and lower face.  Felt some chest tightness earlier but improved PTA.  She denies known exposure to new chemicals, detergents or medications.  State that she takes ibuprofen but none recently.  Also denies difficulty swallowing.    Patient is a 31 y.o. female presenting with allergic reaction. The history is provided by the patient.  Allergic Reaction The primary symptoms are  rash and urticaria. The primary symptoms do not include wheezing, shortness of breath, cough, nausea, vomiting, dizziness, altered mental status or angioedema. The current episode started yesterday. Progression since onset: waxing and waning. This is a new problem.  The rash is associated with itching.   Significant symptoms also include itching. Significant symptoms that are not present include eye redness, flushing or rhinorrhea.    Past Medical History  Diagnosis Date  . Asthma     History reviewed. No pertinent past surgical history.  Family History  Problem Relation Age of Onset  . Cancer Mother   . Diabetes Father     History  Substance Use Topics  . Smoking status: Current Some Day Smoker -- 0.2 packs/day for 2 years    Types: Cigarettes  . Smokeless tobacco: Never Used  . Alcohol Use: Yes     twice a month    OB History    Grav Para Term Preterm Abortions TAB SAB Ect Mult Living   1 1 1       1       Review of Systems  Constitutional: Negative for fever.  HENT:  Positive for facial swelling. Negative for congestion, sore throat, rhinorrhea, mouth sores, trouble swallowing, neck pain and neck stiffness.   Eyes: Negative for redness, itching and visual disturbance.  Respiratory: Positive for chest tightness. Negative for cough, shortness of breath and wheezing.   Cardiovascular: Negative for chest pain.  Gastrointestinal: Negative for nausea and vomiting.  Skin: Positive for itching and rash. Negative for flushing.  Neurological: Negative for dizziness.  Psychiatric/Behavioral: Negative for altered mental status.  All other systems reviewed and are negative.    Allergies  Other  Home Medications   Current Outpatient Rx  Name Route Sig Dispense Refill  . ALBUTEROL SULFATE HFA 108 (90 BASE) MCG/ACT IN AERS Inhalation Inhale 2 puffs into the lungs every 6 (six) hours as needed.    . BECLOMETHASONE DIPROPIONATE 40 MCG/ACT IN AERS Inhalation Inhale 2 puffs into the lungs 2 (two) times daily.    Marland Kitchen DEXTROMETHORPHAN POLISTIREX ER 30 MG/5ML PO LQCR Oral Take 60 mg by mouth every 12 (twelve) hours as needed. For cough    . IBUPROFEN 200 MG PO TABS Oral Take 400 mg by mouth 2 (two) times daily as needed. For body pain/ headache pain    . MENTHOL 10 MG MT LOZG Mouth/Throat Use as directed 1 each in the mouth or throat as needed. For cough and sore throat       BP 129/75  Pulse 81  Temp(Src) 98.7 F (37.1 C) (Oral)  Resp 20  Ht 5\' 3"  (1.6 m)  Wt 171 lb (77.565 kg)  BMI 30.29 kg/m2  SpO2 99%  LMP 07/25/2011  Physical Exam  Nursing note and vitals reviewed. Constitutional: She is oriented to person, place, and time. She appears well-developed and well-nourished. No distress.  HENT:  Head: Normocephalic and atraumatic. No trismus in the jaw.    Mouth/Throat: Uvula is midline, oropharynx is clear and moist and mucous membranes are normal. No uvula swelling.       Mild erythema and discreet maculopapular rash to the lower face.  No obvious edema or  the lips, no generalized rash.  Airway is patent  Cardiovascular: Normal rate, regular rhythm, normal heart sounds and intact distal pulses.   No murmur heard. Pulmonary/Chest: Effort normal and breath sounds normal. No respiratory distress. She has no wheezes. She has no rales.       No stridor  Musculoskeletal: She exhibits no edema.  Neurological: She is alert and oriented to person, place, and time. She exhibits normal muscle tone. Coordination normal.  Skin: Skin is warm. Rash noted. There is erythema.    ED Course  Procedures (including critical care time)  Labs Reviewed - No data to display      MDM     Previous medical charts, nursing notes and vitals signs from this visit were reviewed by me   All laboratory results and/or imaging results performed on this visit, if applicable, were reviewed by me and discussed with the patient and/or parent as well as recommendation for follow-up    MEDICATIONS GIVEN IN ED:    Patient is resting comfortably. Symptoms have improved. Vital signs remained stable. Airway remains patent. No edema of the tongue or lips. Patient has been observed in the emergency department without further progression of symptoms. Talking with friend at bedside and eating chips.  She appears stable for discharge at this time. Advise patient to followup with her primary care physician or to return here if her symptoms worsen.    PRESCRIPTIONS GIVEN AT DISCHARGE: prednisone taper  Pt stable in ED with no significant deterioration in condition. Pt feels improved after observation and/or treatment in ED. Patient / Family / Caregiver understand and agree with initial ED impression and plan with expectations set for ED visit.  Patient agrees to return to ED for any worsening symptoms       Telissa Palmisano L. [place], [place] 08/08/11 2031

## 2011-08-05 NOTE — ED Notes (Signed)
Woke up yesterday with my lips swelling, took benadryl and some swelling came down. Today when I got to work I started having itching and burning in my face and some trouble breathing per pt.

## 2011-08-05 NOTE — ED Notes (Signed)
Pt presents with lip swelling and facial "burning and itching" secondary to an unknown allergon.  Pt denies SOB, throat swelling and tongue swelling.  NAD noted at this time.

## 2011-08-05 NOTE — Discharge Instructions (Signed)
Allergic Reaction  Allergic reactions can be caused by anything your body is sensitive to. Your body may be sensitive to food, medicines, molds, pollens, cockroaches, dust mites, pets, insect stings, and other things around you. An allergic reaction may cause puffiness (swelling), itching, sneezing, coughing, or problems breathing.   Allergies cannot be cured, but they can be controlled with medicine. Some allergies happen only at certain times of the year. Try to stay away from what causes your reaction if possible. Sometimes, it is hard to tell what causes your reaction.  HOME CARE  If you have a rash or red patches (hives) on your skin:   Take medicines as told by your doctor.   Do not drive or drink alcohol after taking medicines. They can make you sleepy.   Put cold cloths on your skin. Take baths in cool water. This will help your itching. Do not take hot baths or showers. Heat will make the itching worse.   If your allergies get worse, your doctor might give you other medicines. Talk to your doctor if problems continue.  GET HELP RIGHT AWAY IF:    You have trouble breathing.   You have a tight feeling in your chest or throat.   Your mouth gets puffy (swollen).   You have red, itchy patches on your skin (hives) that get worse.   You have itching all over your body.  MAKE SURE YOU:    Understand these instructions.   Will watch your condition.   Will get help right away if you are not doing well or get worse.  Document Released: 02/08/2009 Document Revised: 02/09/2011 Document Reviewed: 02/08/2009  [employer] Patient Information 2012 [employer]

## 2011-08-10 ENCOUNTER — Other Ambulatory Visit: Payer: Self-pay | Admitting: Family Medicine

## 2011-08-10 ENCOUNTER — Ambulatory Visit
Admission: RE | Admit: 2011-08-10 | Discharge: 2011-08-10 | Disposition: A | Discharge status: Home / Self Care | Payer: Medicaid Other | Source: Ambulatory Visit | Attending: Family Medicine | Admitting: Family Medicine

## 2011-08-10 DIAGNOSIS — N63 Unspecified lump in unspecified breast: Secondary | ICD-10-CM

## 2011-08-10 DIAGNOSIS — Z1231 Encounter for screening mammogram for malignant neoplasm of breast: Secondary | ICD-10-CM

## 2011-08-10 DIAGNOSIS — Z803 Family history of malignant neoplasm of breast: Secondary | ICD-10-CM

## 2011-08-11 NOTE — ED Provider Notes (Signed)
Medical screening examination/treatment/procedure(s) were performed by non-physician practitioner and as supervising physician I was immediately available for consultation/collaboration.   Shelda Jakes, MD 08/11/11 [phone]

## 2011-08-30 ENCOUNTER — Other Ambulatory Visit: Payer: Medicaid Other

## 2011-09-04 ENCOUNTER — Ambulatory Visit
Admission: RE | Admit: 2011-09-04 | Discharge: 2011-09-04 | Disposition: A | Discharge status: Home / Self Care | Payer: Medicaid Other | Source: Ambulatory Visit | Attending: Family Medicine | Admitting: Family Medicine

## 2011-09-04 DIAGNOSIS — Z803 Family history of malignant neoplasm of breast: Secondary | ICD-10-CM

## 2011-09-04 DIAGNOSIS — N63 Unspecified lump in unspecified breast: Secondary | ICD-10-CM

## 2012-02-25 ENCOUNTER — Emergency Department (HOSPITAL_COMMUNITY)
Admission: EM | Admit: 2012-02-25 | Discharge: 2012-02-25 | Disposition: A | Discharge status: Home / Self Care | Payer: Medicaid Other | Attending: Emergency Medicine | Admitting: Emergency Medicine

## 2012-02-25 ENCOUNTER — Emergency Department (HOSPITAL_COMMUNITY): Payer: Medicaid Other

## 2012-02-25 ENCOUNTER — Encounter (HOSPITAL_COMMUNITY): Payer: Self-pay

## 2012-02-25 DIAGNOSIS — S161XXA Strain of muscle, fascia and tendon at neck level, initial encounter: Secondary | ICD-10-CM

## 2012-02-25 DIAGNOSIS — Z79899 Other long term (current) drug therapy: Secondary | ICD-10-CM | POA: Insufficient documentation

## 2012-02-25 DIAGNOSIS — S0990XA Unspecified injury of head, initial encounter: Secondary | ICD-10-CM | POA: Insufficient documentation

## 2012-02-25 DIAGNOSIS — F17200 Nicotine dependence, unspecified, uncomplicated: Secondary | ICD-10-CM | POA: Insufficient documentation

## 2012-02-25 DIAGNOSIS — J45909 Unspecified asthma, uncomplicated: Secondary | ICD-10-CM | POA: Insufficient documentation

## 2012-02-25 DIAGNOSIS — Y92410 Unspecified street and highway as the place of occurrence of the external cause: Secondary | ICD-10-CM | POA: Insufficient documentation

## 2012-02-25 DIAGNOSIS — Y9389 Activity, other specified: Secondary | ICD-10-CM | POA: Insufficient documentation

## 2012-02-25 DIAGNOSIS — S139XXA Sprain of joints and ligaments of unspecified parts of neck, initial encounter: Secondary | ICD-10-CM | POA: Insufficient documentation

## 2012-02-25 MED ORDER — CYCLOBENZAPRINE HCL 10 MG PO TABS
10.0000 mg | ORAL_TABLET | Freq: Three times a day (TID) | ORAL | Status: DC | PRN
Start: 2012-02-25 — End: 2012-07-13

## 2012-02-25 MED ORDER — HYDROCODONE-ACETAMINOPHEN 5-325 MG PO TABS: 1.0000 | ORAL_TABLET | ORAL | Status: AC | PRN
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
## 2012-07-13 ENCOUNTER — Encounter (HOSPITAL_COMMUNITY): Payer: Self-pay | Admitting: *Deleted

## 2012-07-13 ENCOUNTER — Emergency Department (HOSPITAL_COMMUNITY)
Admission: EM | Admit: 2012-07-13 | Discharge: 2012-07-13 | Disposition: A | Discharge status: Home / Self Care | Payer: Medicaid Other | Attending: Emergency Medicine | Admitting: Emergency Medicine

## 2012-07-13 DIAGNOSIS — IMO0001 Reserved for inherently not codable concepts without codable children: Secondary | ICD-10-CM | POA: Insufficient documentation

## 2012-07-13 DIAGNOSIS — J45909 Unspecified asthma, uncomplicated: Secondary | ICD-10-CM | POA: Insufficient documentation

## 2012-07-13 DIAGNOSIS — F17200 Nicotine dependence, unspecified, uncomplicated: Secondary | ICD-10-CM | POA: Insufficient documentation

## 2012-07-13 DIAGNOSIS — Z87828 Personal history of other (healed) physical injury and trauma: Secondary | ICD-10-CM | POA: Insufficient documentation

## 2012-07-13 DIAGNOSIS — R52 Pain, unspecified: Secondary | ICD-10-CM | POA: Insufficient documentation

## 2012-07-13 DIAGNOSIS — N390 Urinary tract infection, site not specified: Secondary | ICD-10-CM

## 2012-07-13 DIAGNOSIS — Z79899 Other long term (current) drug therapy: Secondary | ICD-10-CM | POA: Insufficient documentation

## 2012-07-13 DIAGNOSIS — J029 Acute pharyngitis, unspecified: Secondary | ICD-10-CM | POA: Insufficient documentation

## 2012-07-13 LAB — URINALYSIS, ROUTINE W REFLEX MICROSCOPIC
Bilirubin Urine: NEGATIVE
Ketones, ur: NEGATIVE mg/dL
Nitrite: NEGATIVE
Urobilinogen, UA: 0.2 mg/dL (ref 0.0–1.0)

## 2012-07-13 LAB — URINE MICROSCOPIC-ADD ON

## 2012-07-13 MED ORDER — ACETAMINOPHEN 500 MG PO TABS
1000.0000 mg | ORAL_TABLET | Freq: Once | ORAL | Status: AC
  Administered 2012-07-13: 1000 mg via ORAL
  Filled 2012-07-13: qty 2

## 2012-07-13 MED ORDER — CEPHALEXIN 500 MG PO CAPS: 500.0000 mg | ORAL_CAPSULE | Freq: Four times a day (QID) | ORAL | Status: DC

## 2012-07-13 NOTE — ED Notes (Signed)
Pt states fever, back pain, generalized body aches, headache and sore throat. Symptoms x 2 days.

## 2012-07-13 NOTE — ED Notes (Signed)
Fever and sore throat x 2 days.  
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
## 2012-07-14 LAB — URINE CULTURE
Colony Count: NO GROWTH
Culture: NO GROWTH

## 2012-07-17 ENCOUNTER — Encounter: Payer: Self-pay | Admitting: Physician Assistant

## 2012-07-17 ENCOUNTER — Ambulatory Visit (INDEPENDENT_AMBULATORY_CARE_PROVIDER_SITE_OTHER): Payer: Medicaid Other | Admitting: Physician Assistant

## 2012-07-17 VITALS — BP 116/84 | HR 84 | Temp 97.2°F | Resp 18 | Ht 60.5 in | Wt 154.0 lb

## 2012-07-17 DIAGNOSIS — K121 Other forms of stomatitis: Secondary | ICD-10-CM

## 2012-07-17 DIAGNOSIS — N390 Urinary tract infection, site not specified: Secondary | ICD-10-CM

## 2012-07-17 DIAGNOSIS — K1230 Oral mucositis (ulcerative), unspecified: Secondary | ICD-10-CM

## 2012-07-17 NOTE — Progress Notes (Signed)
   Patient ID: [name] MRN: [id number], DOB: 1980/10/13, 32 y.o. Date of Encounter: 07/17/2012, 10:43 AM    Chief Complaint:  Chief Complaint  Patient presents with  . ER follow up  generalized body pain, chills, fever (103)  se     HPI: 32 y.o. year old female went to ER 07/13/12 sec to fever, sore throat, body aches. RST was negative. UA was positive. Temp was 103.1. She was diagnosed with UTI and treated with Keflex. She states she is still taking the Keflex, as directed.  She states she had noticed some sores in her mouth priot to going to the ER but her mouth wasn't really bothering then so this was not mentioned at ER visit. However, after that, her mouth sores got worse so she went to the Dentist who had her f/u with oral surgeon. She states that oral surgeon rec she f/u with PCP.   No other complaints.    Home Meds: See attached medication section for any medications that were entered at today's visit. The computer does not put those onto this list.The following list is a list of meds entered prior to today's visit.   Current Outpatient Prescriptions on File Prior to Visit  Medication Sig Dispense Refill  . albuterol (PROVENTIL HFA;VENTOLIN HFA) 108 (90 BASE) MCG/ACT inhaler Inhale 2 puffs into the lungs every 6 (six) hours as needed for wheezing.       . beclomethasone (QVAR) 40 MCG/ACT inhaler Inhale 2 puffs into the lungs 2 (two) times daily.      . cephALEXin (KEFLEX) 500 MG capsule Take 1 capsule (500 mg total) by mouth 4 (four) times daily.  28 capsule  0  . naproxen sodium (ALEVE) 220 MG tablet Take 220 mg by mouth 2 (two) times daily as needed (back pain/headache).       No current facility-administered medications on file prior to visit.    Allergies:  Allergies  Allergen Reactions  . Other     Chap stick       Review of Systems: See HPI for pertinent ROS. All other ROS negative.    Physical Exam: Blood pressure 116/84, pulse 84, temperature 97.2  F (36.2 C), temperature source Oral, resp. rate 18, height 5' 0.5" (1.537 m), weight 154 lb (69.854 kg), last menstrual period 07/11/2012., Body mass index is 29.57 kg/(m^2). General:  Appears in no acute distress. HEENT:. Oral cavity moist, posterior pharynx without exudate, erythema, peritonsillar abscess, or post nasal drip. Gum line around edge of upper and lower teeth on right side only; there is a 1/2 cm area of erythema at this area.  On the roof of her mouth/ palate, there is a 1 cm area of erythema with 3mm area of white coloration in middle of the erythema.  Remainder of oral mucosa, palate, tongue, etc normal.  Neck: Supple. No thyromegaly. No lymphadenopathy. Lungs: Clear bilaterally to auscultation without wheezes, rales, or rhonchi. Breathing is unlabored. Heart: Regular rhythm. No murmurs, rubs, or gallops.    ASSESSMENT AND PLAN:  32 y.o. year old female with  1. UTI (urinary tract infection) Complete Keflex prescribed byy ER  2. Stomatitis This is most likely viral and will resolve spontaneously. She already has some Magic Mouth wash to use prn pain. F/U if worsens or develops new symptoms   Signed, Shon Hale Newell, [place], [facility] 07/17/2012 10:43 AM

## 2012-07-31 ENCOUNTER — Telehealth: Payer: Self-pay | Admitting: Physician Assistant

## 2012-07-31 DIAGNOSIS — Z8632 Personal history of gestational diabetes: Secondary | ICD-10-CM

## 2012-07-31 DIAGNOSIS — E559 Vitamin D deficiency, unspecified: Secondary | ICD-10-CM

## 2012-07-31 DIAGNOSIS — Z0000 Encounter for general adult medical examination without abnormal findings: Secondary | ICD-10-CM

## 2012-07-31 NOTE — Telephone Encounter (Signed)
Appt for CPE 08/07/12.  Orders placed for labs so pt can have done prior to appt.

## 2012-08-07 ENCOUNTER — Ambulatory Visit (INDEPENDENT_AMBULATORY_CARE_PROVIDER_SITE_OTHER): Payer: Medicaid Other | Admitting: Physician Assistant

## 2012-08-07 ENCOUNTER — Encounter: Payer: Self-pay | Admitting: Physician Assistant

## 2012-08-07 ENCOUNTER — Other Ambulatory Visit: Payer: Self-pay | Admitting: Physician Assistant

## 2012-08-07 VITALS — BP 114/70 | HR 72 | Temp 97.1°F | Resp 16 | Ht 61.0 in | Wt 159.0 lb

## 2012-08-07 DIAGNOSIS — Z0000 Encounter for general adult medical examination without abnormal findings: Secondary | ICD-10-CM

## 2012-08-07 DIAGNOSIS — J45909 Unspecified asthma, uncomplicated: Secondary | ICD-10-CM

## 2012-08-07 DIAGNOSIS — Z7251 High risk heterosexual behavior: Secondary | ICD-10-CM

## 2012-08-07 DIAGNOSIS — Z8632 Personal history of gestational diabetes: Secondary | ICD-10-CM | POA: Insufficient documentation

## 2012-08-07 DIAGNOSIS — J4520 Mild intermittent asthma, uncomplicated: Secondary | ICD-10-CM

## 2012-08-07 LAB — SYPHILIS: RPR W/REFLEX TO RPR TITER AND TREPONEMAL ANTIBODIES, TRADITIONAL SCREENING AND DIAGNOSIS ALGORITHM

## 2012-08-07 LAB — HIV ANTIBODY (ROUTINE TESTING W REFLEX): HIV: NONREACTIVE

## 2012-08-07 LAB — WET PREP FOR TRICH, YEAST, CLUE: Yeast Wet Prep HPF POC: NONE SEEN

## 2012-08-07 MED ORDER — ALBUTEROL SULFATE HFA 108 (90 BASE) MCG/ACT IN AERS: 2.0000 | INHALATION_SPRAY | Freq: Four times a day (QID) | RESPIRATORY_TRACT | Status: DC | PRN

## 2012-08-07 NOTE — Progress Notes (Signed)
Patient ID: ARYEL EDELEN MRN: [id number], DOB: 12/07/1980, 32 y.o. Date of Encounter: 08/07/2012,   Chief Complaint: Physical (CPE)  HPI: 32 y.o. y/o AA female  here for CPE.    1- Asthma: Reports that she rarely has any problems with asthma and rarely needs albuterol-maybe once a month on average. Forgets to take QVar. Not using this.  2- Wants STD check. Had done last year here but says she has had multiple sexual partners and has had new partners in past year. Has had no sign/symptom of infection-no abnormal vaginal discharge, no genital skin lesions etc.    Review of Systems: Consitutional: No fever, chills, fatigue, night sweats, lymphadenopathy. No significant/unexplained weight changes. Eyes: No visual changes, eye redness, or discharge. ENT/Mouth: No ear pain, sore throat, nasal drainage, or sinus pain. Cardiovascular: No chest pressure,heaviness, tightness or squeezing, even with exertion. No increased shortness of breath or dyspnea on exertion.No palpitations, edema, orthopnea, PND. Respiratory: No cough, hemoptysis, SOB, or wheezing. Gastrointestinal: No anorexia, dysphagia, reflux, pain, nausea, vomiting, hematemesis, diarrhea, constipation, BRBPR, or melena. Breast: No mass, nodules, bulging, or retraction. No skin changes or inflammation. No nipple discharge. No lymphadenopathy. Genitourinary: No dysuria, hematuria, incontinence, vaginal discharge, pruritis, burning, abnormal bleeding, or pain. Musculoskeletal: No decreased ROM, No joint pain or swelling. No significant pain in neck, back, or extremities. Skin: No rash, pruritis, or concerning lesions. Neurological: No headache, dizziness, syncope, seizures, tremors, memory loss, coordination problems, or paresthesias. Psychological: No anxiety, depression, hallucinations, SI/HI. Endocrine: No polydipsia, polyphagia, polyuria, or known diabetes.No increased fatigue. No palpitations/rapid heart rate. No  significant/unexplained weight change. All other systems were reviewed and are otherwise negative.  Past Medical History  Diagnosis Date  . Asthma   . History of gestational diabetes 03/07/2007     History reviewed. No pertinent past surgical history.  Home Meds:  Current Outpatient Prescriptions on File Prior to Visit  Medication Sig Dispense Refill  . naproxen sodium (ALEVE) 220 MG tablet Take 220 mg by mouth 2 (two) times daily as needed (back pain/headache).       No current facility-administered medications on file prior to visit.    Allergies:  Allergies  Allergen Reactions  . Other     Chap stick     History   Social History  . Marital Status: Single    Spouse Name: N/A    Number of Children: N/A  . Years of Education: N/A   Occupational History  . Not on file.   Social History Main Topics  . Smoking status: Current Some Day Smoker -- 0.25 packs/day for 2 years    Types: Cigarettes  . Smokeless tobacco: Never Used  . Alcohol Use: Yes     Comment: twice a month  . Drug Use: No  . Sexually Active: Yes    Birth Control/ Protection: None   Other Topics Concern  . Not on file   Social History Narrative  . No narrative on file    Family History  Problem Relation Age of Onset  . Cancer Mother 6    Breast Cancer  . Diabetes Father     Physical Exam: Blood pressure 114/70, pulse 72, temperature 97.1 F (36.2 C), temperature source Oral, resp. rate 16, height 5\' 1"  (1.549 m), weight 159 lb (72.122 kg), last menstrual period 07/11/2012., Body mass index is 30.06 kg/(m^2). General: Well developed, well nourished,AAF. Appears in no acute distress. HEENT: Normocephalic, atraumatic. Conjunctiva pink, sclera non-icteric. Pupils 2 mm constricting to 1 mm,  round, regular, and equally reactive to light and accomodation. EOMI. Internal auditory canal clear. TMs with good cone of light and without pathology. Nasal mucosa pink. Nares are without discharge. No sinus  tenderness. Oral mucosa pink.  Pharynx without exudate.   Neck: Supple. Trachea midline. No thyromegaly. Full ROM. No lymphadenopathy.No Carotid Bruits. Lungs: Clear to auscultation bilaterally without wheezes, rales, or rhonchi. Breathing is of normal effort and unlabored. Cardiovascular: RRR with S1 S2. No murmurs, rubs, or gallops. Distal pulses 2+ symmetrically. No carotid or abdominal bruits. Breast: Symmetrical. No masses. Nipples without discharge.She has diffuse fibrocystic changes bilaterally.  Abdomen: Soft, non-tender, non-distended with normoactive bowel sounds. No hepatosplenomegaly or masses. No rebound/guarding. No CVA tenderness. No hernias.  Genitourinary:  External genitalia without lesions. Vaginal mucosa pink. Cervix pink. Mucus-like yellowish discharge present.  No cervical tenderness.Normal uterus size. No adnexal mass or tenderness.  Musculoskeletal: Full range of motion and 5/5 strength throughout. Without swelling, atrophy, tenderness, crepitus, or warmth. Extremities without clubbing, cyanosis, or edema. Calves supple. Skin: Warm and moist without erythema, ecchymosis, wounds, or rash. Neuro: A+Ox3. CN II-XII grossly intact. Moves all extremities spontaneously. Full sensation throughout. Normal gait. DTR 2+ throughout upper and lower extremities. Finger to nose intact. Psych:  Responds to questions appropriately with a normal affect.   Assessment/Plan:  32 y.o. y/o female here for CPE 1. Visit for preventive health examination A. Screening Labs: Had CBC,CMET,TSH, VitD, FLP 11/22/2010. Vit D was low but rest of labs were normal. She has no symptoms to suggest need for repeat.  B. Pap was done here 5/23/203-Normal. C. Screening Mammogram/ Mother with Breast Cancer at age 41: She had screening mammogram 09/04/2011. Was normal. Is to f/u annually. I told her she can schedule this herself without a referral-she is aware to schedule it for after 09/03/2012. D. Immunizations: Will  discuss Tetanus at next OV to see if due for update.  2. Asthma, mild intermittent, uncomplicated Can D/C QVar. Told her to f/u if she has to use Albuterol more frequently in future. O/w, currently stable.  - albuterol (PROVENTIL HFA;VENTOLIN HFA) 108 (90 BASE) MCG/ACT inhaler; Inhale 2 puffs into the lungs every 6 (six) hours as needed for wheezing.  Dispense: 1 Inhaler; Refill: 2  3. High risk sexual behavior - GC/chlamydia probe amp, genital - Hepatitis panel, acute - HIV antibody - HSV(herpes smplx)abs-1+2(IgG+IgM)-bld - RPR - WET PREP FOR TRICH, YEAST, CLUE Have discussed "safe sex practices" and have discussed contraception-she is homosexual and reports no need for contraception.   [address] [place], [place], [facility] 08/07/2012 1:56 PM

## 2012-08-08 LAB — HSV(HERPES SMPLX)ABS-I+II(IGG+IGM)-BLD
HSV 1 Glycoprotein G Ab, IgG: 2.72 IV — ABNORMAL HIGH
HSV 2 Glycoprotein G Ab, IgG: 0.22 IV

## 2012-08-08 LAB — HEPATITIS PANEL, ACUTE: Hep A IgM: NEGATIVE

## 2012-08-08 LAB — GC/CHLAMYDIA PROBE AMP: CT Probe RNA: NEGATIVE

## 2012-08-12 ENCOUNTER — Telehealth: Payer: Self-pay | Admitting: Physician Assistant

## 2012-08-12 NOTE — Addendum Note (Signed)
Addended by: WRAY, [place] on: 08/12/2012 12:14 PM   Modules accepted: Orders

## 2012-08-28 NOTE — Telephone Encounter (Signed)
Pt was made aware.  

## 2012-08-29 ENCOUNTER — Emergency Department (HOSPITAL_COMMUNITY)
Admission: EM | Admit: 2012-08-29 | Discharge: 2012-08-29 | Disposition: A | Discharge status: Home / Self Care | Payer: Medicaid Other | Attending: Emergency Medicine | Admitting: Emergency Medicine

## 2012-08-29 ENCOUNTER — Encounter (HOSPITAL_COMMUNITY): Payer: Self-pay | Admitting: *Deleted

## 2012-08-29 DIAGNOSIS — R221 Localized swelling, mass and lump, neck: Secondary | ICD-10-CM | POA: Insufficient documentation

## 2012-08-29 DIAGNOSIS — L250 Unspecified contact dermatitis due to cosmetics: Secondary | ICD-10-CM | POA: Insufficient documentation

## 2012-08-29 DIAGNOSIS — Z79899 Other long term (current) drug therapy: Secondary | ICD-10-CM | POA: Insufficient documentation

## 2012-08-29 DIAGNOSIS — R220 Localized swelling, mass and lump, head: Secondary | ICD-10-CM | POA: Insufficient documentation

## 2012-08-29 DIAGNOSIS — F17200 Nicotine dependence, unspecified, uncomplicated: Secondary | ICD-10-CM | POA: Insufficient documentation

## 2012-08-29 DIAGNOSIS — L299 Pruritus, unspecified: Secondary | ICD-10-CM | POA: Insufficient documentation

## 2012-08-29 DIAGNOSIS — T7840XA Allergy, unspecified, initial encounter: Secondary | ICD-10-CM

## 2012-08-29 DIAGNOSIS — Z8632 Personal history of gestational diabetes: Secondary | ICD-10-CM | POA: Insufficient documentation

## 2012-08-29 DIAGNOSIS — J45909 Unspecified asthma, uncomplicated: Secondary | ICD-10-CM | POA: Insufficient documentation

## 2012-08-29 MED ORDER — PREDNISONE 20 MG PO TABS
40.0000 mg | ORAL_TABLET | Freq: Once | ORAL | Status: AC
  Administered 2012-08-29: 40 mg via ORAL
  Filled 2012-08-29: qty 2

## 2012-08-29 MED ORDER — PREDNISONE 20 MG PO TABS: 40.0000 mg | ORAL_TABLET | Freq: Every day | ORAL | Status: DC

## 2012-08-29 MED ORDER — DIPHENHYDRAMINE HCL 25 MG PO CAPS
25.0000 mg | ORAL_CAPSULE | Freq: Once | ORAL | Status: AC
  Administered 2012-08-29: 25 mg via ORAL
  Filled 2012-08-29: qty 1

## 2012-08-29 NOTE — ED Notes (Signed)
Patient complaining of generalized itching starting today. States "I haven't used or ate anything different except I went to the pool and used a different sunscreen." No obvious distress noted. Patient alert and talking at this time.

## 2012-08-29 NOTE — ED Notes (Signed)
Pt noticed rash and redness around mouth yesterday, today worse with rash around neck and trunk. Only change in daily regimen is use of sunscreen yesterday while swimming. Pt noticed rash after swimming.

## 2012-08-29 NOTE — ED Provider Notes (Signed)
History    CSN: [id number] Arrival date & time 08/29/12  2028  First MD Initiated Contact with Patient 08/29/12 2041     Chief Complaint  Patient presents with  . Allergic Reaction  . Rash  . Angioedema   (Consider location/radiation/quality/duration/timing/severity/associated sxs/prior Treatment) HPI Comments: 32 year old female who has a history of an allergic reaction after using Chapstick in the past presents within 24 hours of going swimming at which time she used a spray on suntan lotion. She had never used this in the past, and within several hours of using this lotion she developed a itchy rash on her bilateral arms, around her upper chest and neck and on her face. This is persistent, mild, associated with mild lip swelling but no tongue swelling, no shortness of breath, no difficulty swallowing or breathing. She has had no medications prior to arrival   Patient is a 32 y.o. female presenting with allergic reaction and rash. The history is provided by the patient.  Allergic Reaction Presenting symptoms: rash   Presenting symptoms: no difficulty swallowing and no wheezing   Rash Associated symptoms: no shortness of breath and no sore throat    Past Medical History  Diagnosis Date  . Asthma   . History of gestational diabetes 03/07/2007   History reviewed. No pertinent past surgical history. Family History  Problem Relation Age of Onset  . Cancer Mother 29    Breast Cancer  . Diabetes Father    History  Substance Use Topics  . Smoking status: Current Some Day Smoker -- 0.25 packs/day for 2 years    Types: Cigarettes  . Smokeless tobacco: Never Used  . Alcohol Use: Yes     Comment: twice a month   OB History   Grav Para Term Preterm Abortions TAB SAB Ect Mult Living   1 1 1       1      Review of Systems  HENT: Negative for sore throat, mouth sores and trouble swallowing.   Respiratory: Negative for shortness of breath and wheezing.   Skin: Positive for rash.     Allergies  Other  Home Medications   Current Outpatient Rx  Name  Route  Sig  Dispense  Refill  . albuterol (PROVENTIL HFA;VENTOLIN HFA) 108 (90 BASE) MCG/ACT inhaler   Inhalation   Inhale 2 puffs into the lungs every 6 (six) hours as needed for wheezing.   1 Inhaler   2   . naproxen sodium (ALEVE) 220 MG tablet   Oral   Take 220 mg by mouth 2 (two) times daily as needed (back pain/headache).         . predniSONE (DELTASONE) 20 MG tablet   Oral   Take 2 tablets (40 mg total) by mouth daily. Take 40 mg by mouth daily for 3 days, then 20mg  by mouth daily for 3 days, then 10mg  daily for 3 days   12 tablet   0    BP 113/74  Pulse 70  Temp(Src) 98 F (36.7 C) (Oral)  Ht 5\' 2"  (1.575 m)  Wt 154 lb (69.854 kg)  BMI 28.16 kg/m2  SpO2 100% Physical Exam  Nursing note and vitals reviewed. Constitutional: She appears well-developed and well-nourished. No distress.  HENT:  Head: Normocephalic and atraumatic.  Mouth/Throat: Oropharynx is clear and moist. No oropharyngeal exudate.  Eyes: Conjunctivae and EOM are normal. Pupils are equal, round, and reactive to light. Right eye exhibits no discharge. Left eye exhibits no discharge. No scleral icterus.  Neck: Normal range of motion. Neck supple. No JVD present. No thyromegaly present.  Cardiovascular: Normal rate, regular rhythm, normal heart sounds and intact distal pulses.  Exam reveals no gallop and no friction rub.   No murmur heard. Pulmonary/Chest: Effort normal and breath sounds normal. No respiratory distress. She has no wheezes. She has no rales.  Musculoskeletal: Normal range of motion. She exhibits no edema and no tenderness.  Lymphadenopathy:    She has no cervical adenopathy.  Neurological: She is alert. Coordination normal.  Skin: Skin is warm and dry. Rash noted. No erythema.  Papular rash located on the bialteral UE's, upper neck and upper chest, small am't on legs.  Face as well.  Mild swelling of lips, no  angioedema  Psychiatric: She has a normal mood and affect. Her behavior is normal.    ED Course  Procedures (including critical care time) Labs Reviewed - No data to display No results found. 1. Allergic reaction, initial encounter     MDM  Specifically the patient does not have any signs of true angioedema, she is not on an ACE inhibitor and because of the papular rash in the distribution of where the suntan lotion would've been I suspect that this is a topical exposure, a contact dermatitis that can be easily treated with oral medications. She has no airway problems, she is stable for discharge  Meds given in ED:  Medications  predniSONE (DELTASONE) tablet 40 mg (not administered)  diphenhydrAMINE (BENADRYL) capsule 25 mg (not administered)    New Prescriptions   PREDNISONE (DELTASONE) 20 MG TABLET    Take 2 tablets (40 mg total) by mouth daily. Take 40 mg by mouth daily for 3 days, then 20mg  by mouth daily for 3 days, then 10mg  daily for 3 days      Vida Roller, MD 08/29/12 2056

## 2012-09-05 ENCOUNTER — Other Ambulatory Visit: Payer: Self-pay

## 2012-09-05 DIAGNOSIS — Z1231 Encounter for screening mammogram for malignant neoplasm of breast: Secondary | ICD-10-CM

## 2012-09-10 ENCOUNTER — Encounter (HOSPITAL_COMMUNITY): Payer: Self-pay | Admitting: *Deleted

## 2012-09-10 ENCOUNTER — Emergency Department (HOSPITAL_COMMUNITY)
Admission: EM | Admit: 2012-09-10 | Discharge: 2012-09-10 | Disposition: A | Discharge status: Home / Self Care | Payer: Medicaid Other | Attending: Emergency Medicine | Admitting: Emergency Medicine

## 2012-09-10 DIAGNOSIS — Z87828 Personal history of other (healed) physical injury and trauma: Secondary | ICD-10-CM | POA: Insufficient documentation

## 2012-09-10 DIAGNOSIS — Z8632 Personal history of gestational diabetes: Secondary | ICD-10-CM | POA: Insufficient documentation

## 2012-09-10 DIAGNOSIS — F17200 Nicotine dependence, unspecified, uncomplicated: Secondary | ICD-10-CM | POA: Insufficient documentation

## 2012-09-10 DIAGNOSIS — Z79899 Other long term (current) drug therapy: Secondary | ICD-10-CM | POA: Insufficient documentation

## 2012-09-10 DIAGNOSIS — M62830 Muscle spasm of back: Secondary | ICD-10-CM

## 2012-09-10 DIAGNOSIS — M5380 Other specified dorsopathies, site unspecified: Secondary | ICD-10-CM | POA: Insufficient documentation

## 2012-09-10 DIAGNOSIS — J45909 Unspecified asthma, uncomplicated: Secondary | ICD-10-CM | POA: Insufficient documentation

## 2012-09-10 MED ORDER — HYDROCODONE-ACETAMINOPHEN 5-325 MG PO TABS
1.0000 | ORAL_TABLET | Freq: Once | ORAL | Status: AC
  Administered 2012-09-10: 1 via ORAL
  Filled 2012-09-10: qty 1

## 2012-09-10 MED ORDER — CYCLOBENZAPRINE HCL 10 MG PO TABS: 10.0000 mg | ORAL_TABLET | Freq: Three times a day (TID) | ORAL | Status: DC | PRN

## 2012-09-10 MED ORDER — CYCLOBENZAPRINE HCL 10 MG PO TABS
10.0000 mg | ORAL_TABLET | Freq: Once | ORAL | Status: AC
  Administered 2012-09-10: 10 mg via ORAL
  Filled 2012-09-10: qty 1

## 2012-09-10 MED ORDER — HYDROCODONE-ACETAMINOPHEN 5-325 MG PO TABS: ORAL_TABLET | ORAL | Status: DC
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
## 2012-09-10 NOTE — ED Notes (Signed)
Instructions, prescriptions and f/u information given/reviewed; verbalizes understanding. Left in c/o family for transport home.  
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
## 2012-09-13 NOTE — ED Provider Notes (Signed)
History    CSN: [id number] Arrival date & time 09/10/12  1726  First MD Initiated Contact with Patient 09/10/12 1751     Chief Complaint  Patient presents with  . Back Pain   (Consider location/radiation/quality/duration/timing/severity/associated sxs/prior Treatment) Patient is a 32 y.o. female presenting with back pain. The history is provided by the patient.  Back Pain Location:  Lumbar spine Quality:  Cramping Radiates to:  Does not radiate Pain severity:  Moderate Pain is:  Same all the time Onset quality:  Gradual Duration:  2 days Timing:  Intermittent Progression:  Worsening Chronicity:  Recurrent Context: not falling, not lifting heavy objects, not recent illness and not recent injury   Relieved by:  Nothing Worsened by:  Ambulation, bending, movement and twisting Ineffective treatments:  NSAIDs Associated symptoms: no abdominal pain, no abdominal swelling, no bladder incontinence, no bowel incontinence, no chest pain, no dysuria, no fever, no headaches, no leg pain, no numbness, no paresthesias, no pelvic pain, no perianal numbness, no tingling and no weakness    Past Medical History  Diagnosis Date  . Asthma   . History of gestational diabetes 03/07/2007   History reviewed. No pertinent past surgical history. Family History  Problem Relation Age of Onset  . Cancer Mother 41    Breast Cancer  . Diabetes Father    History  Substance Use Topics  . Smoking status: Current Some Day Smoker -- 0.25 packs/day for 2 years    Types: Cigarettes  . Smokeless tobacco: Never Used  . Alcohol Use: Yes     Comment: twice a month   OB History   Grav Para Term Preterm Abortions TAB SAB Ect Mult Living   1 1 1       1      Review of Systems  Constitutional: Negative for fever.  Respiratory: Negative for shortness of breath.   Cardiovascular: Negative for chest pain.  Gastrointestinal: Negative for vomiting, abdominal pain, constipation and bowel incontinence.   Genitourinary: Negative for bladder incontinence, dysuria, hematuria, flank pain, decreased urine volume, difficulty urinating and pelvic pain.       No perineal numbness or incontinence of urine or feces  Musculoskeletal: Positive for back pain. Negative for joint swelling.  Skin: Negative for rash.  Neurological: Negative for tingling, weakness, numbness, headaches and paresthesias.  All other systems reviewed and are negative.    Allergies  Other  Home Medications   Current Outpatient Rx  Name  Route  Sig  Dispense  Refill  . albuterol (PROVENTIL HFA;VENTOLIN HFA) 108 (90 BASE) MCG/ACT inhaler   Inhalation   Inhale 2 puffs into the lungs every 6 (six) hours as needed for wheezing.   1 Inhaler   2   . naproxen sodium (ALEVE) 220 MG tablet   Oral   Take 220 mg by mouth 2 (two) times daily as needed (back pain/headache).         . cyclobenzaprine (FLEXERIL) 10 MG tablet   Oral   Take 1 tablet (10 mg total) by mouth 3 (three) times daily as needed for muscle spasms.   21 tablet   0   . HYDROcodone-acetaminophen (NORCO/VICODIN) 5-325 MG per tablet      Take one-two tabs po q 4-6 hrs prn pain   10 tablet   0    BP 121/82  Pulse 83  Temp(Src) 98.5 F (36.9 C) (Oral)  Resp 18  Ht 5\' 2"  (1.575 m)  Wt 155 lb (70.308 kg)  BMI 28.34  kg/m2  SpO2 100%  LMP 08/11/2012 Physical Exam  Nursing note and vitals reviewed. Constitutional: She is oriented to person, place, and time. She appears well-developed and well-nourished. No distress.  HENT:  Head: Normocephalic and atraumatic.  Neck: Normal range of motion. Neck supple.  Cardiovascular: Normal rate, regular rhythm, normal heart sounds and intact distal pulses.   No murmur heard. Pulmonary/Chest: Effort normal and breath sounds normal. No respiratory distress.  Musculoskeletal: She exhibits tenderness. She exhibits no edema.       Lumbar back: She exhibits tenderness and pain. She exhibits normal range of motion, no  swelling, no deformity, no laceration and normal pulse.  ttp of the lumbar paraspinal muscles.  No spinal tenderness.  DP pulses are brisk and symmetrical.  Distal sensation intact.  Hip Flexors/Extensors are intact  Neurological: She is alert and oriented to person, place, and time. No cranial nerve deficit or sensory deficit. She exhibits normal muscle tone. Coordination and gait normal.  Reflex Scores:      Patellar reflexes are 2+ on the right side and 2+ on the left side.      Achilles reflexes are 2+ on the right side and 2+ on the left side. Skin: Skin is warm and dry.    ED Course  Procedures (including critical care time) Labs Reviewed - No data to display No results found. 1. Muscle spasm of back     MDM    Patient has ttp of the lumbar paraspinal muscles.  No focal neuro deficits on exam.  Ambulates with a steady gait.   Doubt emergent neurological or infectious process.    Pain improved after flexeril and vicodin.  Pt agrees to ice , rest and close f/u with her PMD  Fredrick Geoghegan L. Trisha Mangle, PA-C 09/13/12 2133

## 2012-09-14 NOTE — ED Provider Notes (Signed)
Medical screening examination/treatment/procedure(s) were performed by non-physician practitioner and as supervising physician I was immediately available for consultation/collaboration.   Tayden Nichelson L Rani Idler, MD 09/14/12 1533 

## 2012-09-25 ENCOUNTER — Ambulatory Visit
Admission: RE | Admit: 2012-09-25 | Discharge: 2012-09-25 | Disposition: A | Discharge status: Home / Self Care | Payer: Medicaid Other | Source: Ambulatory Visit

## 2012-09-25 DIAGNOSIS — Z1231 Encounter for screening mammogram for malignant neoplasm of breast: Secondary | ICD-10-CM

## 2012-10-09 ENCOUNTER — Telehealth: Payer: Self-pay | Admitting: Physician Assistant

## 2012-10-09 NOTE — Telephone Encounter (Signed)
Mammogram was normal patient made aware

## 2012-11-15 ENCOUNTER — Telehealth: Payer: Self-pay | Admitting: Physician Assistant

## 2012-11-15 DIAGNOSIS — J4520 Mild intermittent asthma, uncomplicated: Secondary | ICD-10-CM

## 2012-11-15 MED ORDER — ALBUTEROL SULFATE HFA 108 (90 BASE) MCG/ACT IN AERS: 2.0000 | INHALATION_SPRAY | Freq: Four times a day (QID) | RESPIRATORY_TRACT | Status: DC | PRN

## 2012-11-15 NOTE — Telephone Encounter (Signed)
Medication refilled per protocol. 

## 2012-11-15 NOTE — Telephone Encounter (Signed)
Albuterol Inhaler  

## 2013-04-29 ENCOUNTER — Emergency Department (HOSPITAL_COMMUNITY)
Admission: EM | Admit: 2013-04-29 | Discharge: 2013-04-29 | Disposition: A | Discharge status: Home / Self Care | Payer: Medicaid Other | Attending: Emergency Medicine | Admitting: Emergency Medicine

## 2013-04-29 ENCOUNTER — Encounter (HOSPITAL_COMMUNITY): Payer: Self-pay | Admitting: Emergency Medicine

## 2013-04-29 DIAGNOSIS — K5289 Other specified noninfective gastroenteritis and colitis: Secondary | ICD-10-CM | POA: Insufficient documentation

## 2013-04-29 DIAGNOSIS — K529 Noninfective gastroenteritis and colitis, unspecified: Secondary | ICD-10-CM

## 2013-04-29 DIAGNOSIS — F17200 Nicotine dependence, unspecified, uncomplicated: Secondary | ICD-10-CM | POA: Insufficient documentation

## 2013-04-29 DIAGNOSIS — J45909 Unspecified asthma, uncomplicated: Secondary | ICD-10-CM | POA: Insufficient documentation

## 2013-04-29 LAB — COMPREHENSIVE METABOLIC PANEL
ALK PHOS: 65 U/L (ref 39–117)
ALT: 35 U/L (ref 0–35)
AST: 34 U/L (ref 0–37)
Albumin: 4.4 g/dL (ref 3.5–5.2)
BUN: 8 mg/dL (ref 6–23)
CO2: 22 meq/L (ref 19–32)
Calcium: 9.5 mg/dL (ref 8.4–10.5)
Chloride: 99 mEq/L (ref 96–112)
Creatinine, Ser: 0.59 mg/dL (ref 0.50–1.10)
GLUCOSE: 136 mg/dL — AB (ref 70–99)
POTASSIUM: 4.2 meq/L (ref 3.7–5.3)
SODIUM: 135 meq/L — AB (ref 137–147)
Total Bilirubin: 0.5 mg/dL (ref 0.3–1.2)
Total Protein: 9.1 g/dL — ABNORMAL HIGH (ref 6.0–8.3)

## 2013-04-29 LAB — CBC WITH DIFFERENTIAL/PLATELET
Basophils Absolute: 0 10*3/uL (ref 0.0–0.1)
Basophils Relative: 0 % (ref 0–1)
Eosinophils Absolute: 0 10*3/uL (ref 0.0–0.7)
Eosinophils Relative: 1 % (ref 0–5)
HCT: 42.5 % (ref 36.0–46.0)
Hemoglobin: 14.6 g/dL (ref 12.0–15.0)
LYMPHS ABS: 0.8 10*3/uL (ref 0.7–4.0)
LYMPHS PCT: 14 % (ref 12–46)
MCH: 29.5 pg (ref 26.0–34.0)
MCHC: 34.4 g/dL (ref 30.0–36.0)
MCV: 85.9 fL (ref 78.0–100.0)
Monocytes Absolute: 0.4 10*3/uL (ref 0.1–1.0)
Monocytes Relative: 6 % (ref 3–12)
NEUTROS ABS: 4.5 10*3/uL (ref 1.7–7.7)
NEUTROS PCT: 79 % — AB (ref 43–77)
PLATELETS: 213 10*3/uL (ref 150–400)
RBC: 4.95 MIL/uL (ref 3.87–5.11)
RDW: 12.3 % (ref 11.5–15.5)
WBC: 5.6 10*3/uL (ref 4.0–10.5)

## 2013-04-29 MED ORDER — SODIUM CHLORIDE 0.9 % IV BOLUS (SEPSIS)
1000.0000 mL | Freq: Once | INTRAVENOUS | Status: AC
  Administered 2013-04-29: 1000 mL via INTRAVENOUS

## 2013-04-29 MED ORDER — ACETAMINOPHEN 325 MG PO TABS: 650.0000 mg | ORAL_TABLET | Freq: Once | ORAL | Status: AC

## 2013-04-29 MED ORDER — ACETAMINOPHEN 325 MG PO TABS
ORAL_TABLET | ORAL | Status: AC
  Administered 2013-04-29: 650 mg via ORAL
  Filled 2013-04-29: qty 2

## 2013-04-29 MED ORDER — IBUPROFEN 800 MG PO TABS: ORAL_TABLET | ORAL | Status: AC

## 2013-04-29 MED ORDER — KETOROLAC TROMETHAMINE 30 MG/ML IJ SOLN
30.0000 mg | Freq: Once | INTRAMUSCULAR | Status: AC
  Administered 2013-04-29: 30 mg via INTRAVENOUS
  Filled 2013-04-29: qty 1

## 2013-04-29 MED ORDER — IBUPROFEN 800 MG PO TABS: ORAL_TABLET | ORAL | Status: DC

## 2013-04-29 MED ORDER — ONDANSETRON HCL 4 MG/2ML IJ SOLN
4.0000 mg | Freq: Once | INTRAMUSCULAR | Status: AC
  Administered 2013-04-29: 4 mg via INTRAVENOUS
  Filled 2013-04-29: qty 2

## 2013-04-29 MED ORDER — ONDANSETRON 4 MG PO TBDP: ORAL_TABLET | ORAL | Status: DC

## 2013-04-29 NOTE — ED Notes (Signed)
NVD, headache, fever onset today.  Alert,

## 2013-04-29 NOTE — Discharge Instructions (Signed)
Drink plenty of fluids.  Follow up if not improving,   Take imodium for diarhea

## 2013-04-29 NOTE — ED Provider Notes (Signed)
CSN: [id number]     Arrival date & time 04/29/13  1808 History   First MD Initiated Contact with Patient 04/29/13 1819     Chief Complaint  Patient presents with  . Emesis     (Consider location/radiation/quality/duration/timing/severity/associated sxs/prior Treatment) Patient is a 33 y.o. female presenting with diarrhea. The history is provided by the patient (pt complains of vomiting and diarhea).  Diarrhea Severity:  Moderate Onset quality:  Sudden Timing:  Constant Progression:  Unchanged Associated symptoms: abdominal pain   Associated symptoms: no headaches   Risk factors: no recent antibiotic use     Past Medical History  Diagnosis Date  . Asthma   . History of gestational diabetes 03/07/2007   Past Surgical History  Procedure Laterality Date  . Esophagogastroduodenoscopy endoscopy     Family History  Problem Relation Age of Onset  . Cancer Mother 64    Breast Cancer  . Diabetes Father    History  Substance Use Topics  . Smoking status: Current Some Day Smoker -- 0.25 packs/day for 2 years    Types: Cigarettes  . Smokeless tobacco: Never Used  . Alcohol Use: Yes     Comment: twice a month   OB History   Grav Para Term Preterm Abortions TAB SAB Ect Mult Living   1 1 1       1      Review of Systems  Constitutional: Negative for appetite change and fatigue.  HENT: Negative for congestion, ear discharge and sinus pressure.   Eyes: Negative for discharge.  Respiratory: Negative for cough.   Cardiovascular: Negative for chest pain.  Gastrointestinal: Positive for abdominal pain and diarrhea.  Genitourinary: Negative for frequency and hematuria.  Musculoskeletal: Negative for back pain.  Skin: Negative for rash.  Neurological: Negative for seizures and headaches.  Psychiatric/Behavioral: Negative for hallucinations.      Allergies  Other  Home Medications   Current Outpatient Rx  Name  Route  Sig  Dispense  Refill  . naproxen sodium (ALEVE) 220 MG  tablet   Oral   Take 220 mg by mouth 2 (two) times daily as needed (back pain/headache).         Marland Kitchen ibuprofen (ADVIL,MOTRIN) 800 MG tablet      Take one every 8 hours for aches and pain   15 tablet   0   . ondansetron (ZOFRAN ODT) 4 MG disintegrating tablet      4mg  ODT q4 hours prn nausea/vomit   10 tablet   0    BP 117/52  Pulse 96  Temp(Src) 99.8 F (37.7 C) (Oral)  Resp 17  Ht 5\' 2"  (1.575 m)  Wt 154 lb (69.854 kg)  BMI 28.16 kg/m2  SpO2 96%  LMP 04/19/2013 Physical Exam  Constitutional: She is oriented to person, place, and time. She appears well-developed.  HENT:  Head: Normocephalic.  Eyes: Conjunctivae and EOM are normal. No scleral icterus.  Neck: Neck supple. No thyromegaly present.  Cardiovascular: Normal rate and regular rhythm.  Exam reveals no gallop and no friction rub.   No murmur heard. Pulmonary/Chest: No stridor. She has no wheezes. She has no rales. She exhibits no tenderness.  Abdominal: She exhibits no distension. There is tenderness. There is no rebound.  Musculoskeletal: Normal range of motion. She exhibits no edema.  Lymphadenopathy:    She has no cervical adenopathy.  Neurological: She is oriented to person, place, and time. She exhibits normal muscle tone. Coordination normal.  Skin: No rash noted. No  erythema.  Psychiatric: She has a normal mood and affect. Her behavior is normal.    ED Course  Procedures (including critical care time) Labs Review Labs Reviewed  CBC WITH DIFFERENTIAL - Abnormal; Notable for the following:    Neutrophils Relative % 79 (*)    All other components within normal limits  COMPREHENSIVE METABOLIC PANEL - Abnormal; Notable for the following:    Sodium 135 (*)    Glucose, Bld 136 (*)    Total Protein 9.1 (*)    All other components within normal limits   Imaging Review No results found.  EKG Interpretation   None       MDM   Final diagnoses:  Gastroenteritis   Pt improved with  tx    Benny LennertJoseph L Gerilynn Mccullars, MD 04/29/13 2018

## 2013-08-20 ENCOUNTER — Encounter: Payer: Medicaid Other | Admitting: Physician Assistant

## 2013-08-25 ENCOUNTER — Other Ambulatory Visit: Payer: Medicaid Other

## 2013-08-25 ENCOUNTER — Other Ambulatory Visit: Payer: Self-pay | Admitting: Physician Assistant

## 2013-08-25 ENCOUNTER — Encounter: Payer: Self-pay | Admitting: Physician Assistant

## 2013-08-25 DIAGNOSIS — Z0000 Encounter for general adult medical examination without abnormal findings: Secondary | ICD-10-CM

## 2013-08-25 DIAGNOSIS — Z8639 Personal history of other endocrine, nutritional and metabolic disease: Secondary | ICD-10-CM

## 2013-08-25 DIAGNOSIS — Z79899 Other long term (current) drug therapy: Secondary | ICD-10-CM

## 2013-08-25 LAB — CBC WITH DIFFERENTIAL/PLATELET
BASOS ABS: 0 10*3/uL (ref 0.0–0.1)
BASOS PCT: 0 % (ref 0–1)
EOS ABS: 0.1 10*3/uL (ref 0.0–0.7)
Eosinophils Relative: 2 % (ref 0–5)
HEMATOCRIT: 39.3 % (ref 36.0–46.0)
Hemoglobin: 13.3 g/dL (ref 12.0–15.0)
Lymphocytes Relative: 53 % — ABNORMAL HIGH (ref 12–46)
Lymphs Abs: 3.6 10*3/uL (ref 0.7–4.0)
MCH: 28.6 pg (ref 26.0–34.0)
MCHC: 33.8 g/dL (ref 30.0–36.0)
MCV: 84.5 fL (ref 78.0–100.0)
MONO ABS: 0.5 10*3/uL (ref 0.1–1.0)
Monocytes Relative: 7 % (ref 3–12)
NEUTROS ABS: 2.6 10*3/uL (ref 1.7–7.7)
Neutrophils Relative %: 38 % — ABNORMAL LOW (ref 43–77)
Platelets: 197 10*3/uL (ref 150–400)
RBC: 4.65 MIL/uL (ref 3.87–5.11)
RDW: 14.2 % (ref 11.5–15.5)
WBC: 6.8 10*3/uL (ref 4.0–10.5)

## 2013-08-25 LAB — COMPLETE METABOLIC PANEL WITH GFR
ALK PHOS: 45 U/L (ref 39–117)
ALT: 15 U/L (ref 0–35)
AST: 13 U/L (ref 0–37)
Albumin: 4.6 g/dL (ref 3.5–5.2)
BILIRUBIN TOTAL: 0.4 mg/dL (ref 0.2–1.2)
BUN: 13 mg/dL (ref 6–23)
CO2: 24 meq/L (ref 19–32)
CREATININE: 0.68 mg/dL (ref 0.50–1.10)
Calcium: 9.6 mg/dL (ref 8.4–10.5)
Chloride: 102 mEq/L (ref 96–112)
GFR, Est African American: >89 mL/min
GFR, Est Non African American: >89 mL/min
Glucose, Bld: 130 mg/dL — ABNORMAL HIGH (ref 70–99)
Potassium: 4.1 mEq/L (ref 3.5–5.3)
SODIUM: 133 meq/L — AB (ref 135–145)
TOTAL PROTEIN: 8 g/dL (ref 6.0–8.3)

## 2013-08-25 LAB — LIPID PANEL
Cholesterol: 169 mg/dL (ref 0–200)
HDL: 43 mg/dL (ref >39)
LDL CALC: 99 mg/dL (ref 0–99)
TRIGLYCERIDES: 136 mg/dL (ref <150)
Total CHOL/HDL Ratio: 3.9 Ratio
VLDL: 27 mg/dL (ref 0–40)

## 2013-08-25 LAB — HEMOGLOBIN A1C
HEMOGLOBIN A1C: 6.8 % — AB (ref <5.7)
MEAN PLASMA GLUCOSE: 148 mg/dL — AB (ref <117)

## 2013-08-25 LAB — TSH: TSH: 1.275 u[IU]/mL (ref 0.350–4.500)

## 2013-08-27 ENCOUNTER — Encounter: Payer: Self-pay | Admitting: Physician Assistant

## 2013-08-27 ENCOUNTER — Ambulatory Visit (INDEPENDENT_AMBULATORY_CARE_PROVIDER_SITE_OTHER): Payer: Medicaid Other | Admitting: Physician Assistant

## 2013-08-27 VITALS — BP 108/60 | HR 84 | Temp 99.2°F | Resp 18 | Ht 61.5 in | Wt 163.0 lb

## 2013-08-27 DIAGNOSIS — Z803 Family history of malignant neoplasm of breast: Secondary | ICD-10-CM | POA: Insufficient documentation

## 2013-08-27 DIAGNOSIS — E119 Type 2 diabetes mellitus without complications: Secondary | ICD-10-CM | POA: Insufficient documentation

## 2013-08-27 DIAGNOSIS — Z7251 High risk heterosexual behavior: Secondary | ICD-10-CM

## 2013-08-27 DIAGNOSIS — Z0000 Encounter for general adult medical examination without abnormal findings: Secondary | ICD-10-CM

## 2013-08-27 NOTE — Progress Notes (Signed)
Patient ID: [name] MRN: [id number], DOB: [birth year], 33 y.o. Date of Encounter: 08/27/2013,   Chief Complaint: Physical (CPE)  HPI: 33 y.o. y/o AA female  here for CPE.    1- Asthma: Reports that she rarely has any problems with asthma. Rarely needs albuterol.   2- Wants STD check. Had done last year here but says she has had multiple sexual partners and has had new partners in past year. Has had no sign/symptom of infection-no abnormal vaginal discharge, no genital skin lesions etc.    Review of Systems: Consitutional: No fever, chills, fatigue, night sweats, lymphadenopathy. No significant/unexplained weight changes. Eyes: No visual changes, eye redness, or discharge. ENT/Mouth: No ear pain, sore throat, nasal drainage, or sinus pain. Cardiovascular: No chest pressure,heaviness, tightness or squeezing, even with exertion. No increased shortness of breath or dyspnea on exertion.No palpitations, edema, orthopnea, PND. Respiratory: No cough, hemoptysis, SOB, or wheezing. Gastrointestinal: No anorexia, dysphagia, reflux, pain, nausea, vomiting, hematemesis, diarrhea, constipation, BRBPR, or melena. Breast: No mass, nodules, bulging, or retraction. No skin changes or inflammation. No nipple discharge. No lymphadenopathy. Genitourinary: No dysuria, hematuria, incontinence, vaginal discharge, pruritis, burning, abnormal bleeding, or pain. Musculoskeletal: No decreased ROM, No joint pain or swelling. No significant pain in neck, back, or extremities. Skin: No rash, pruritis, or concerning lesions. Neurological: No headache, dizziness, syncope, seizures, tremors, memory loss, coordination problems, or paresthesias. Psychological: No anxiety, depression, hallucinations, SI/HI. Endocrine: No polydipsia, polyphagia, polyuria, or known diabetes.No increased fatigue. No palpitations/rapid heart rate. No significant/unexplained weight change. All other systems were reviewed and are  otherwise negative.  Past Medical History  Diagnosis Date  . Asthma   . History of gestational diabetes 03/07/2007     Past Surgical History  Procedure Laterality Date  . Esophagogastroduodenoscopy endoscopy      Home Meds:  Current Outpatient Prescriptions on File Prior to Visit  Medication Sig Dispense Refill  . ibuprofen (ADVIL,MOTRIN) 800 MG tablet Take one every 8 hours for aches and pain  15 tablet  0  . naproxen sodium (ALEVE) 220 MG tablet Take 220 mg by mouth 2 (two) times daily as needed (back pain/headache).       No current facility-administered medications on file prior to visit.    Allergies:  Allergies  Allergen Reactions  . Other     Chap stick     History   Social History  . Marital Status: Single    Spouse Name: N/A    Number of Children: N/A  . Years of Education: N/A   Occupational History  . Not on file.   Social History Main Topics  . Smoking status: Current Some Day Smoker -- 0.25 packs/day for 2 years    Types: Cigarettes  . Smokeless tobacco: Never Used  . Alcohol Use: Yes     Comment: twice a month  . Drug Use: No  . Sexual Activity: Yes    Birth Control/ Protection: None   Other Topics Concern  . 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 Cancer Mother 7138    Breast Cancer  . Diabetes Father     Physical Exam: Blood pressure 108/60, pulse 84, temperature 99.2 F (37.3  C), temperature source Oral, resp. rate 18, height 5' 1.5" (1.562 m), weight 163 lb (73.936 kg), last menstrual period 07/30/2013., Body mass index is 30.3 kg/(m^2). General: Well developed, well nourished,AAF. Appears in no acute distress. HEENT: Normocephalic, atraumatic. Conjunctiva pink, sclera  non-icteric. Pupils 2 mm constricting to 1 mm, round, regular, and equally reactive to light and accomodation. EOMI. Internal auditory canal clear. TMs with good cone of light and without pathology. Nasal mucosa pink. Nares are without discharge. No sinus tenderness. Oral mucosa pink.  Pharynx without exudate.   Neck: Supple. Trachea midline. No thyromegaly. Full ROM. No lymphadenopathy.No Carotid Bruits. Lungs: Clear to auscultation bilaterally without wheezes, rales, or rhonchi. Breathing is of normal effort and unlabored. Cardiovascular: RRR with S1 S2. No murmurs, rubs, or gallops. Distal pulses 2+ symmetrically. No carotid or abdominal bruits. Breast: Symmetrical. No masses. Nipples without discharge.She has diffuse fibrocystic changes bilaterally.  Abdomen: Soft, non-tender, non-distended with normoactive bowel sounds. No hepatosplenomegaly or masses. No rebound/guarding. No CVA tenderness. No hernias.  Genitourinary:  External genitalia without lesions. Vaginal mucosa pink. Cervix pink. Mucus-like yellowish discharge present.  No cervical tenderness.Normal uterus size. No adnexal mass or tenderness.  Musculoskeletal: Full range of motion and 5/5 strength throughout. Without swelling, atrophy, tenderness, crepitus, or warmth. Extremities without clubbing, cyanosis, or edema. Calves supple. Skin: Warm and moist without erythema, ecchymosis, wounds, or rash. Neuro: A+Ox3. CN II-XII grossly intact. Moves all extremities spontaneously. Full sensation throughout. Normal gait. DTR 2+ throughout upper and lower extremities. Finger to nose intact. Psych:  Responds to questions appropriately with a normal affect.   Assessment/Plan:  33 y.o. y/o female here for CPE  1. Visit for preventive health examination  A. Screening Labs: Came Fasting For Labs. Had CBC,CMET,TSH,  FLP . All normal.   A1C was also done. Elevated at 6.8  Day I gave and reviewed low carbohydrate diet handout. I told her to make diet  changes and follow this low carbohydrate diet. Toward the clinic in 3 months to recheck A1c and follow up.   B. Pap was done here 07/27/2011-Normal. Can wait to repeat.  C. Screening Mammogram/ Mother with Breast Cancer at age 31: She has annual mammograms.Patient reports that she has continued to mammogram annually. Has had in past year.  D. Immunizations:  Tetanus: Had in 2006.  2. Asthma, mild intermittent, uncomplicated Can D/C QVar. Told her to f/u if she has to use Albuterol more frequently in future. O/w, currently stable.  - albuterol (PROVENTIL HFA;VENTOLIN HFA) 108 (90 BASE) MCG/ACT inhaler; Inhale 2 puffs into the lungs every 6 (six) hours as needed for wheezing.  Dispense: 1 Inhaler; Refill: 2  3. High risk sexual behavior - Hepatitis panel, acute - HIV antibody - HSV(herpes smplx)abs-1+2(IgG+IgM)-bld - RPR Have discussed "safe sex practices" and have discussed contraception-she is homosexual and reports no need for contraception.   4. Diabetes: She has a history of gestational diabetes during pregnancy in 2009. She states that since then she has heard no further mention of diabetes. She has had no further pregnancies since 2009. Discussed that current A1c is 6.8. She does no exercise. Discussed that ideally she needs to do some cardiovascular exercise. However discussed that if this is going to be difficult to fit into her schedule, that rather than get overwhelmed, I would rather her start with just making diet changes. Today I gave and reviewed low carbohydrate diet handout. She voices understanding. She is to start following this and come in for followup office  visit in 3 months to recheck A1c.  Murray Hodgkins [place], [place], [facility] 08/27/2013 4:42 PM

## 2013-08-28 LAB — RPR

## 2013-08-28 LAB — HSV(HERPES SMPLX)ABS-I+II(IGG+IGM)-BLD
HERPES SIMPLEX VRS I-IGM AB (EIA): 1.09 {index}
HSV 1 Glycoprotein G Ab, IgG: 3.14 IV — ABNORMAL HIGH
HSV 2 GLYCOPROTEIN G AB, IGG: 0.1 IV

## 2013-08-28 LAB — HEPATITIS PANEL, ACUTE
HCV AB: NEGATIVE
HEP A IGM: NONREACTIVE
HEP B C IGM: NONREACTIVE
HEP B S AG: NEGATIVE

## 2013-08-28 LAB — HIV ANTIBODY (ROUTINE TESTING W REFLEX): HIV 1&2 Ab, 4th Generation: NONREACTIVE

## 2013-09-17 ENCOUNTER — Encounter: Payer: Self-pay | Admitting: Physician Assistant

## 2013-09-17 ENCOUNTER — Ambulatory Visit (INDEPENDENT_AMBULATORY_CARE_PROVIDER_SITE_OTHER): Payer: Medicaid Other | Admitting: Physician Assistant

## 2013-09-17 VITALS — BP 100/68 | HR 78 | Temp 98.1°F | Resp 16 | Ht 61.5 in | Wt 159.0 lb

## 2013-09-17 DIAGNOSIS — G43909 Migraine, unspecified, not intractable, without status migrainosus: Secondary | ICD-10-CM

## 2013-09-17 MED ORDER — SUMATRIPTAN SUCCINATE 100 MG PO TABS: ORAL_TABLET | ORAL | Status: AC

## 2013-09-17 NOTE — Progress Notes (Signed)
Patient ID: [name] MRN: [id number], DOB: [birth year], 33 y.o. Date of Encounter: 09/17/2013, 11:11 AM    Chief Complaint:  Chief Complaint  Patient presents with  . Migraine    Getting them more frequently     HPI: 33 y.o. year old AA female states that she has been having migraine headaches recently. Says that she has had this evaluated once at an urgent care in [place] and another time at an ER --says they gave her an injection.  Reports that her sister has migraines. Patient reports that she (the pt) did not have many migraines until the past year. They seem to be increasing in frequency over the past couple of months. Says that when they come on she has pain starting behind both eyes, then spreads back, over top of head. With the headaches, she has significant photophobia and phonophobia. Has had no vomiting. Says that recently they've gotten to where she has been taking ibuprofen with very minimal relief.  Discussed possible triggers. She says that she does have increased stress right now. Says that she is trying to move. Currently is living with her sister but is looking for a new place and may have to even move to [place]. Also asked about her sleep. Typically sleeps from about 10:30 PM to 7:30 or 8 AM. No problems with insomnia.  She has had no blurred vision no changes in vision. She has had no neurologic changes/deficits.     Home Meds:   Outpatient Prescriptions Prior to Visit  Medication Sig Dispense Refill  . cyclobenzaprine (FLEXERIL) 10 MG tablet Take 10 mg by mouth 3 (three) times daily as needed for muscle spasms.      Marland Kitchen. HYDROCODONE-ACETAMINOPHEN PO Take by mouth.      Marland Kitchen. ibuprofen (ADVIL,MOTRIN) 800 MG tablet Take one every 8 hours for aches and pain  15 tablet  0  . naproxen sodium (ALEVE) 220 MG tablet Take 220 mg by mouth 2 (two) times daily as needed (back pain/headache).       No facility-administered medications prior to visit.      Allergies:  Allergies  Allergen Reactions  . Other     Chap stick       Review of Systems: See HPI for pertinent ROS. All other ROS negative.    Physical Exam: Blood pressure 100/68, pulse 78, temperature 98.1 F (36.7 C), temperature source Oral, resp. rate 16, height 5' 1.5" (1.562 m), weight 159 lb (72.122 kg), last menstrual period 07/30/2013., Body mass index is 29.56 kg/(m^2). General:  WNWD AAF. Appears in no acute distress. HEENT: Normocephalic, atraumatic, eyes without discharge, sclera non-icteric, nares are without discharge. Extraocular movements intact, normal. No nystagmus. Pupils reactive to light bilaterally.  Neck: Supple. No thyromegaly. No lymphadenopathy. Lungs: Clear bilaterally to auscultation without wheezes, rales, or rhonchi. Breathing is unlabored. Heart: Regular rhythm. No murmurs, rubs, or gallops. Msk:  Strength and tone normal for age. Extremities/Skin: Warm and dry.  Neuro: Alert and oriented X 3. Moves all extremities spontaneously. Gait is normal. CNII-XII grossly in tact. Psych:  Responds to questions appropriately with a normal affect.     ASSESSMENT AND PLAN:  33 y.o. year old female with  1. Migraine without status migrainosus, not intractable, unspecified migraine type Will give her Imitrex to use for migraine. Discussed proper use of it, printed in the Sig below.  If the Imitrex is ineffective, or migraines increase in frequency, or if she begins to develop any additional  symptoms, followup. - SUMAtriptan (IMITREX) 100 MG tablet; Take 1 at onset of migraine. In 2 hours, may take a 2nd dose if needed. Max 2 per 24 hours.  Dispense: 10 tablet; Refill: 0   Signed, [address] [place], [place], [facility] 09/17/2013 11:11 AM

## 2013-10-21 ENCOUNTER — Telehealth: Payer: Self-pay | Admitting: Physician Assistant

## 2013-10-21 NOTE — Telephone Encounter (Signed)
[phone]  Pt seen MBD for her migraines and was given something for it but she is still having them and wants to know what to do nexy

## 2013-10-21 NOTE — Telephone Encounter (Signed)
Per MBD note if medication is ineffective or not working pt is to follow-up with her to discuss. Appt. Made with pt.

## 2013-10-23 ENCOUNTER — Ambulatory Visit (INDEPENDENT_AMBULATORY_CARE_PROVIDER_SITE_OTHER): Payer: Medicaid Other | Admitting: Physician Assistant

## 2013-10-23 ENCOUNTER — Encounter: Payer: Self-pay | Admitting: Physician Assistant

## 2013-10-23 VITALS — BP 116/60 | HR 58 | Temp 98.4°F | Resp 12 | Ht 61.0 in | Wt 159.0 lb

## 2013-10-23 DIAGNOSIS — J988 Other specified respiratory disorders: Secondary | ICD-10-CM

## 2013-10-23 DIAGNOSIS — B9789 Other viral agents as the cause of diseases classified elsewhere: Secondary | ICD-10-CM

## 2013-10-23 DIAGNOSIS — G43001 Migraine without aura, not intractable, with status migrainosus: Secondary | ICD-10-CM

## 2013-10-23 MED ORDER — TOPIRAMATE 25 MG PO TABS: ORAL_TABLET | ORAL | Status: DC

## 2013-10-23 MED ORDER — FLUTICASONE PROPIONATE 50 MCG/ACT NA SUSP: 2.0000 | Freq: Every day | NASAL | Status: AC

## 2013-10-23 MED ORDER — TOPIRAMATE 25 MG PO TABS
ORAL_TABLET | ORAL | Status: AC
Start: 2013-10-23 — End: ?

## 2013-10-23 NOTE — Progress Notes (Signed)
Patient ID: [name] MRN: [id number], DOB: [birth year], 33 y.o. Date of Encounter: 10/23/2013, 5:14 PM    Chief Complaint:  Chief Complaint  Patient presents with  . Frequent Migraines    x3 months- had bad migraine on Monday- taking Imitrex and Advil Migraine medicaiton- resolved  . Sinus Pressure    x2 days- pressure under eyes on cheeks  . Check sugar     HPI: 33 y.o. year old AA female says that her migraines have been occurring much more frequently recently. She actually has an office visit with me on 09/17/13 secondary to statin status migrainous. Today she reports that she has been having 2-3 migraines per week for the last several months now. Says that last week she had a migraine lasted all day. He took Imitrex and Advil migraine with minimal relief. Her migraines occur behind her eyes. She does have significant phonophobia and photophobia with her migraines. Has no vision changes with her migraines.  Also complains that she has been having nasal congestion for the past 2 days. Some rhinorrhea but no significant thick mucus. She has had no sore throat no chest congestion and no fevers or chills.     Home Meds:   Outpatient Prescriptions Prior to Visit  Medication Sig Dispense Refill  . cyclobenzaprine (FLEXERIL) 10 MG tablet Take 10 mg by mouth 3 (three) times daily as needed for muscle spasms.      Marland Kitchen. HYDROCODONE-ACETAMINOPHEN PO Take by mouth.      Marland Kitchen. ibuprofen (ADVIL,MOTRIN) 800 MG tablet Take one every 8 hours for aches and pain  15 tablet  0  . naproxen sodium (ALEVE) 220 MG tablet Take 220 mg by mouth 2 (two) times daily as needed (back pain/headache).      . SUMAtriptan (IMITREX) 100 MG tablet Take 1 at onset of migraine. In 2 hours, may take a 2nd dose if needed. Max 2 per 24 hours.  10 tablet  0   No facility-administered medications prior to visit.    Allergies:  Allergies  Allergen Reactions  . Other     Chap stick       Review of Systems:  See HPI for pertinent ROS. All other ROS negative.    Physical Exam: Blood pressure 116/60, pulse 58, temperature 98.4 F (36.9 C), temperature source Oral, resp. rate 12, height 5\' 1"  (1.549 m), weight 159 lb (72.122 kg), last menstrual period 10/04/2013., Body mass index is 30.06 kg/(m^2). General:  WNWD AAF. Appears in no acute distress.   She sounds very congested when she speaks. HEENT: Normocephalic, atraumatic, eyes without discharge, sclera non-icteric, nares are without discharge. Bilateral auditory canals clear, TM's are without perforation, pearly grey and translucent with reflective cone of light bilaterally. Oral cavity moist, posterior pharynx without exudate, erythema, peritonsillar abscess, or post nasal drip.  Neck: Supple. No thyromegaly. No lymphadenopathy. Lungs: Clear bilaterally to auscultation without wheezes, rales, or rhonchi. Breathing is unlabored. Heart: Regular rhythm. No murmurs, rubs, or gallops. Msk:  Strength and tone normal for age. Extremities/Skin: Warm and dry. Neuro: Alert and oriented X 3. Moves all extremities spontaneously. Gait is normal. CNII-XII grossly in tact. Pupils equal and reactive to light bilaterally. Romberg normal. Psych:  Responds to questions appropriately with a normal affect.     ASSESSMENT AND PLAN:  33 y.o. year old female with  1. Migraine without aura and with status migrainosus, not intractable Will start Topamax as a preventive medication.  I discussed proper dosing with her  and discussed that this dosing is printed on her AVS today and will also be printed on the medication bottle.  Reviewed with her to make sure to take it as directed.  Cautioned that side effects can include cognitive slowing and peripheral numbness. She will schedule followup office visit with me in about 3 weeks to follow up . Follow up sooner if having any adverse effects to the medication.  - topiramate (TOPAMAX) 25 MG tablet; One at night for 1 week  then One twice a day for 1 week then One in morning, 2 at night for 1 week then 2   twice a day  Dispense: 60 tablet; Refill: 1  2. Viral respiratory infection - fluticasone (FLONASE) 50 MCG/ACT nasal spray; Place 2 sprays into both nostrils daily.  Dispense: 16 g; Refill: 6  3. she asked about rechecking hemoglobin A1c today. I discussed that it has not been quite a full 3 months since the last A1c sit and transfer will not cover to do one yet. Told her we will repeat A1c at her followup office visit in 3-4 weeks.   [address] [place], [place], [facility] 10/23/2013 5:14 PM

## 2013-11-27 ENCOUNTER — Ambulatory Visit: Payer: Medicaid Other | Admitting: Physician Assistant

## 2014-01-05 ENCOUNTER — Encounter: Payer: Self-pay | Admitting: Physician Assistant

## 2014-03-31 ENCOUNTER — Encounter: Payer: Self-pay | Admitting: Physician Assistant

## 2014-05-13 ENCOUNTER — Encounter: Payer: Self-pay | Admitting: Physician Assistant

## 2014-05-18 ENCOUNTER — Encounter: Payer: Self-pay | Admitting: Physician Assistant

## 2014-06-25 ENCOUNTER — Encounter: Payer: Self-pay | Admitting: Physician Assistant

## 2014-08-31 ENCOUNTER — Other Ambulatory Visit: Payer: Self-pay

## 2014-11-05 ENCOUNTER — Telehealth: Payer: Self-pay | Admitting: Physician Assistant

## 2014-11-05 NOTE — Telephone Encounter (Signed)
Patient called requesting a prescription called into [facility] [phone] for SUMAtriptan (IMITREX) 100 MG tablet

## 2014-11-05 NOTE — Telephone Encounter (Signed)
Patient has been discharged from practice.  Medication refill denied
